# Patient Record
Sex: Female | Born: 1950
Health system: Southern US, Community
[De-identification: ages and names within clinical notes are randomized; demographics above are authoritative.]

## PROBLEM LIST (undated history)

## (undated) DIAGNOSIS — T7840XA Allergy, unspecified, initial encounter: Secondary | ICD-10-CM

## (undated) DIAGNOSIS — E785 Hyperlipidemia, unspecified: Secondary | ICD-10-CM

## (undated) DIAGNOSIS — M069 Rheumatoid arthritis, unspecified: Secondary | ICD-10-CM

## (undated) DIAGNOSIS — H269 Unspecified cataract: Secondary | ICD-10-CM

## (undated) DIAGNOSIS — G4730 Sleep apnea, unspecified: Secondary | ICD-10-CM

## (undated) DIAGNOSIS — K219 Gastro-esophageal reflux disease without esophagitis: Secondary | ICD-10-CM

## (undated) DIAGNOSIS — K2270 Barrett's esophagus without dysplasia: Secondary | ICD-10-CM

## (undated) DIAGNOSIS — K449 Diaphragmatic hernia without obstruction or gangrene: Secondary | ICD-10-CM

## (undated) DIAGNOSIS — I10 Essential (primary) hypertension: Secondary | ICD-10-CM

## (undated) DIAGNOSIS — F419 Anxiety disorder, unspecified: Secondary | ICD-10-CM

## (undated) DIAGNOSIS — I340 Nonrheumatic mitral (valve) insufficiency: Secondary | ICD-10-CM

## (undated) DIAGNOSIS — G43109 Migraine with aura, not intractable, without status migrainosus: Secondary | ICD-10-CM

## (undated) HISTORY — PX: UPPER GASTROINTESTINAL ENDOSCOPY: SHX188

## (undated) HISTORY — PX: COLONOSCOPY: SHX174

## (undated) HISTORY — DX: Essential (primary) hypertension: I10

## (undated) HISTORY — DX: Anxiety disorder, unspecified: F41.9

## (undated) HISTORY — DX: Allergy, unspecified, initial encounter: T78.40XA

## (undated) HISTORY — DX: Sleep apnea, unspecified: G47.30

## (undated) HISTORY — DX: Barrett's esophagus without dysplasia: K22.70

## (undated) HISTORY — DX: Nonrheumatic mitral (valve) insufficiency: I34.0

## (undated) HISTORY — DX: Gastro-esophageal reflux disease without esophagitis: K21.9

## (undated) HISTORY — DX: Migraine with aura, not intractable, without status migrainosus: G43.109

## (undated) HISTORY — DX: Unspecified cataract: H26.9

## (undated) HISTORY — DX: Diaphragmatic hernia without obstruction or gangrene: K44.9

## (undated) HISTORY — DX: Hyperlipidemia, unspecified: E78.5

## (undated) HISTORY — DX: Rheumatoid arthritis, unspecified: M06.9

---

## 1993-03-31 HISTORY — PX: VAGINAL HYSTERECTOMY: SHX2639

## 1999-02-06 ENCOUNTER — Other Ambulatory Visit: Admission: RE | Admit: 1999-02-06 | Discharge: 1999-02-06 | Payer: Self-pay | Admitting: Obstetrics & Gynecology

## 1999-05-29 ENCOUNTER — Other Ambulatory Visit: Admission: RE | Admit: 1999-05-29 | Discharge: 1999-05-29 | Payer: Self-pay | Admitting: Obstetrics & Gynecology

## 1999-06-24 ENCOUNTER — Other Ambulatory Visit: Admission: RE | Admit: 1999-06-24 | Discharge: 1999-06-24 | Payer: Self-pay | Admitting: Obstetrics & Gynecology

## 1999-07-24 ENCOUNTER — Other Ambulatory Visit: Admission: RE | Admit: 1999-07-24 | Discharge: 1999-07-24 | Payer: Self-pay | Admitting: Obstetrics & Gynecology

## 1999-08-12 ENCOUNTER — Other Ambulatory Visit: Admission: RE | Admit: 1999-08-12 | Discharge: 1999-08-12 | Payer: Self-pay | Admitting: Obstetrics & Gynecology

## 2000-02-05 ENCOUNTER — Other Ambulatory Visit: Admission: RE | Admit: 2000-02-05 | Discharge: 2000-02-05 | Payer: Self-pay | Admitting: Obstetrics & Gynecology

## 2000-07-29 ENCOUNTER — Other Ambulatory Visit: Admission: RE | Admit: 2000-07-29 | Discharge: 2000-07-29 | Payer: Self-pay | Admitting: Obstetrics & Gynecology

## 2001-03-17 ENCOUNTER — Other Ambulatory Visit: Admission: RE | Admit: 2001-03-17 | Discharge: 2001-03-17 | Payer: Self-pay | Admitting: Obstetrics & Gynecology

## 2001-11-04 ENCOUNTER — Other Ambulatory Visit: Admission: RE | Admit: 2001-11-04 | Discharge: 2001-11-04 | Payer: Self-pay | Admitting: Obstetrics & Gynecology

## 2002-06-10 ENCOUNTER — Other Ambulatory Visit: Admission: RE | Admit: 2002-06-10 | Discharge: 2002-06-10 | Payer: Self-pay | Admitting: Obstetrics & Gynecology

## 2002-07-22 ENCOUNTER — Encounter: Payer: Self-pay | Admitting: Internal Medicine

## 2002-07-28 ENCOUNTER — Encounter: Payer: Self-pay | Admitting: Internal Medicine

## 2002-07-28 ENCOUNTER — Ambulatory Visit (HOSPITAL_COMMUNITY): Admission: RE | Admit: 2002-07-28 | Discharge: 2002-07-28 | Payer: Self-pay | Admitting: Internal Medicine

## 2002-12-07 ENCOUNTER — Other Ambulatory Visit: Admission: RE | Admit: 2002-12-07 | Discharge: 2002-12-07 | Payer: Self-pay | Admitting: Obstetrics & Gynecology

## 2003-04-01 HISTORY — PX: LAPAROSCOPIC OOPHORECTOMY: SUR783

## 2003-08-02 ENCOUNTER — Other Ambulatory Visit: Admission: RE | Admit: 2003-08-02 | Discharge: 2003-08-02 | Payer: Self-pay | Admitting: Obstetrics and Gynecology

## 2003-08-02 ENCOUNTER — Other Ambulatory Visit: Admission: RE | Admit: 2003-08-02 | Discharge: 2003-08-02 | Payer: Self-pay | Admitting: Obstetrics & Gynecology

## 2004-02-02 ENCOUNTER — Other Ambulatory Visit: Admission: RE | Admit: 2004-02-02 | Discharge: 2004-02-02 | Payer: Self-pay | Admitting: Obstetrics & Gynecology

## 2004-08-02 ENCOUNTER — Other Ambulatory Visit: Admission: RE | Admit: 2004-08-02 | Discharge: 2004-08-02 | Payer: Self-pay | Admitting: Obstetrics & Gynecology

## 2005-01-31 ENCOUNTER — Other Ambulatory Visit: Admission: RE | Admit: 2005-01-31 | Discharge: 2005-01-31 | Payer: Self-pay | Admitting: Obstetrics & Gynecology

## 2006-05-26 ENCOUNTER — Encounter: Admission: RE | Admit: 2006-05-26 | Discharge: 2006-05-26 | Payer: Self-pay | Admitting: Orthopedic Surgery

## 2006-12-03 ENCOUNTER — Encounter: Admission: RE | Admit: 2006-12-03 | Discharge: 2006-12-03 | Payer: Self-pay | Admitting: Orthopedic Surgery

## 2007-10-25 ENCOUNTER — Encounter: Admission: RE | Admit: 2007-10-25 | Discharge: 2007-10-25 | Payer: Self-pay | Admitting: Family Medicine

## 2009-03-20 ENCOUNTER — Emergency Department (HOSPITAL_BASED_OUTPATIENT_CLINIC_OR_DEPARTMENT_OTHER): Admission: EM | Admit: 2009-03-20 | Discharge: 2009-03-20 | Payer: Self-pay | Admitting: Emergency Medicine

## 2009-03-20 ENCOUNTER — Ambulatory Visit: Payer: Self-pay | Admitting: Diagnostic Radiology

## 2009-03-31 HISTORY — PX: COLONOSCOPY: SHX174

## 2009-04-24 ENCOUNTER — Encounter: Payer: Self-pay | Admitting: Cardiology

## 2009-04-24 ENCOUNTER — Encounter (INDEPENDENT_AMBULATORY_CARE_PROVIDER_SITE_OTHER): Payer: Self-pay | Admitting: *Deleted

## 2009-05-09 ENCOUNTER — Ambulatory Visit: Payer: Self-pay | Admitting: Cardiology

## 2009-05-09 DIAGNOSIS — R079 Chest pain, unspecified: Secondary | ICD-10-CM

## 2009-05-09 DIAGNOSIS — E7800 Pure hypercholesterolemia, unspecified: Secondary | ICD-10-CM | POA: Insufficient documentation

## 2009-05-23 ENCOUNTER — Telehealth: Payer: Self-pay | Admitting: Cardiology

## 2009-05-29 ENCOUNTER — Ambulatory Visit: Payer: Self-pay

## 2009-05-29 ENCOUNTER — Ambulatory Visit (HOSPITAL_COMMUNITY): Admission: RE | Admit: 2009-05-29 | Discharge: 2009-05-29 | Payer: Self-pay | Admitting: Cardiology

## 2009-05-29 ENCOUNTER — Encounter: Payer: Self-pay | Admitting: Cardiology

## 2009-05-29 ENCOUNTER — Ambulatory Visit: Payer: Self-pay | Admitting: Internal Medicine

## 2009-05-31 DIAGNOSIS — G43109 Migraine with aura, not intractable, without status migrainosus: Secondary | ICD-10-CM | POA: Insufficient documentation

## 2009-05-31 DIAGNOSIS — R141 Gas pain: Secondary | ICD-10-CM

## 2009-05-31 DIAGNOSIS — G43909 Migraine, unspecified, not intractable, without status migrainosus: Secondary | ICD-10-CM

## 2009-05-31 DIAGNOSIS — R143 Flatulence: Secondary | ICD-10-CM

## 2009-05-31 DIAGNOSIS — R142 Eructation: Secondary | ICD-10-CM

## 2009-05-31 DIAGNOSIS — E785 Hyperlipidemia, unspecified: Secondary | ICD-10-CM | POA: Insufficient documentation

## 2009-05-31 DIAGNOSIS — F411 Generalized anxiety disorder: Secondary | ICD-10-CM

## 2009-06-04 ENCOUNTER — Ambulatory Visit: Payer: Self-pay | Admitting: Internal Medicine

## 2009-06-04 ENCOUNTER — Telehealth: Payer: Self-pay | Admitting: Internal Medicine

## 2009-06-04 DIAGNOSIS — K5900 Constipation, unspecified: Secondary | ICD-10-CM | POA: Insufficient documentation

## 2009-06-06 ENCOUNTER — Telehealth: Payer: Self-pay | Admitting: Cardiology

## 2009-06-06 ENCOUNTER — Ambulatory Visit: Payer: Self-pay | Admitting: Internal Medicine

## 2009-06-12 ENCOUNTER — Encounter: Payer: Self-pay | Admitting: Internal Medicine

## 2009-06-19 ENCOUNTER — Telehealth: Payer: Self-pay | Admitting: Internal Medicine

## 2009-07-06 ENCOUNTER — Ambulatory Visit: Payer: Self-pay | Admitting: Internal Medicine

## 2009-07-06 DIAGNOSIS — K2270 Barrett's esophagus without dysplasia: Secondary | ICD-10-CM | POA: Insufficient documentation

## 2009-09-18 ENCOUNTER — Telehealth: Payer: Self-pay | Admitting: Cardiology

## 2010-03-06 ENCOUNTER — Telehealth: Payer: Self-pay | Admitting: Cardiology

## 2010-04-28 LAB — CONVERTED CEMR LAB
HDL: 39.1 mg/dL (ref >39.00)
LDL Cholesterol: 81 mg/dL (ref 0–99)
Total Bilirubin: 0.7 mg/dL (ref 0.3–1.2)
Total CHOL/HDL Ratio: 4
Triglycerides: 94 mg/dL (ref 0.0–149.0)

## 2010-04-30 NOTE — Progress Notes (Signed)
Summary: rx request  Phone Note Call from Patient Call back at Home Phone [phone]   Caller: Patient [phone] Reason for Call: Talk to Nurse Summary of Call: simavastin for cholesterol wants called to [facility]-dr told her he would prescribe once she ran out  Initial call taken by: Glynda Jaeger,  September 18, 2009 2:12 PM    Prescriptions: SIMVASTATIN 20 MG TABS (SIMVASTATIN) once daily  #30 x 6   Entered by:   Judithe Modest CMA   Authorized by:   Marca Ancona, MD   Signed by:   Judithe Modest CMA on 09/18/2009   Method used:   [facility]        [facility] [phone]* (retail)       [address]       [place], [place]  [address]       Ph: [phone]       Fax: [phone]   RxID:   [id number]

## 2010-04-30 NOTE — Letter (Signed)
Summary: [facility] Instructions  [facility]  [address] [place], [place] [address]   Phone: [phone]  Fax: [phone]       Cynthia Strickland    11-Apr-1950    MRN: [id number]       Procedure Day /Date: 06/06/09 (Wednesday)     Arrival Time: 1:00 pm     Procedure Time: 2:00 pm     Location of Procedure:                    _x _  [facility] (4th Floor)   PREPARATION FOR COLONOSCOPY WITH MIRALAX  Starting 5 days prior to your procedure (today) do not eat nuts, seeds, popcorn, corn, beans, peas,  salads, or any raw vegetables.  Do not take any fiber supplements (e.g. Metamucil, Citrucel, and Benefiber). ____________________________________________________________________________________________________   THE DAY BEFORE YOUR PROCEDURE         DATE: 06/05/09 DAY: Tuesday  1   Drink clear liquids the entire day-NO SOLID FOOD  2   Do not drink anything colored red or purple.  Avoid juices with pulp.  No orange juice.  3   Drink at least 64 oz. (8 glasses) of fluid/clear liquids during the day to prevent dehydration and help the prep work efficiently.  CLEAR LIQUIDS INCLUDE: Water Jello Ice Popsicles Tea (sugar ok, no milk/cream) Powdered fruit flavored drinks Coffee (sugar ok, no milk/cream) Gatorade Juice: apple, white grape, white cranberry  Lemonade Clear bullion, consomm, broth Carbonated beverages (any kind) Strained chicken noodle soup Hard Candy  4   Mix the entire bottle of Miralax with 64 oz. of Gatorade/Powerade in the morning and put in the refrigerator to chill.  5   At 3:00 pm take 2 Dulcolax/Bisacodyl tablets.  6   At 4:30 pm take one Reglan/Metoclopramide tablet.  7  Starting at 5:00 pm drink one 8 oz glass of the Miralax mixture every 15-20 minutes until you have finished drinking the entire 64 oz.  You should finish drinking prep around 7:30 or 8:00 pm.  8   If you are nauseated, you may take the 2nd Reglan/Metoclopramide  tablet at 6:30 pm.        9    At 8:00 pm take 2 more DULCOLAX/Bisacodyl tablets.        THE DAY OF YOUR PROCEDURE      DATE:  06/06/09 DAY: Wednesday  You may drink clear liquids until 12:00 pm  (2 HOURS BEFORE PROCEDURE).   MEDICATION INSTRUCTIONS  Unless otherwise instructed, you should take regular prescription medications with a small sip of water as early as possible the morning of your procedure.       OTHER INSTRUCTIONS  You will need a responsible adult at least 58 years of age to accompany you and drive you home.   This person must remain in the waiting room during your procedure.  Wear loose fitting clothing that is easily removed.  Leave jewelry and other valuables at home.  However, you may wish to bring a book to read or an iPod/MP3 player to listen to music as you wait for your procedure to start.  Remove all body piercing jewelry and leave at home.  Total time from sign-in until discharge is approximately 2-3 hours.  You should go home directly after your procedure and rest.  You can resume normal activities the day after your procedure.  The day of your procedure you should not:   Drive   Make  legal decisions   Operate machinery   Drink alcohol   Return to work  You will receive specific instructions about eating, activities and medications before you leave.   The above instructions have been reviewed and explained to me by  Hortense Ramal CMA Duncan Dull)  June 04, 2009 1:54 PM     I fully understand and can verbalize these instructions _____________________________ Date 06/04/09

## 2010-04-30 NOTE — Letter (Signed)
Summary: New Patient letter  [facility]  [address] [place], [place] [address]   Phone: [phone]  Fax: [phone]       04/24/2009 MRN: [id number]  [facility] Cynthia Strickland [address] [place], [place]  [address]  Dear Cynthia Strickland,  Welcome to the Gastroenterology Division at [facility].    You are scheduled to see Dr. Juanda Chance on 06-04-09 at 1:30p.m. on the 3rd floor at [facility], [address]  We ask that you try to arrive at our office 15 minutes prior to your appointment time to allow for check-in.  We would like you to complete the enclosed self-administered evaluation form prior to your visit and bring it with you on the day of your appointment.  We will review it with you.  Also, please bring a complete list of all your medications or, if you prefer, bring the medication bottles and we will list them.  Please bring your insurance card so that we may make a copy of it.  If your insurance requires a referral to see a specialist, please bring your referral form from your primary care physician.  Co-payments are due at the time of your visit and may be paid by cash, check or credit card.     Your office visit will consist of a consult with your physician (includes a physical exam), any laboratory testing he/she may order, scheduling of any necessary diagnostic testing (e.g. x-ray, ultrasound, CT-scan), and scheduling of a procedure (e.g. Endoscopy, Colonoscopy) if required.  Please allow enough time on your schedule to allow for any/all of these possibilities.    If you cannot keep your appointment, please call [phone] to cancel or reschedule prior to your appointment date.  This allows [place] the opportunity to schedule an appointment for another patient in need of care.  If you do not cancel or reschedule by 5 p.m. the business day prior to your appointment date, you will be charged a $50.00 late cancellation/no-show fee.    Thank you for choosing  [facility] for your medical needs.  We appreciate the opportunity to care for you.  Please visit [place] at our website  to learn more about our practice.                     Sincerely,                                                             The Gastroenterology Division

## 2010-04-30 NOTE — Assessment & Plan Note (Signed)
Summary: (4pm appt)  F/U FROM ENDO. AND NEW DX. OF BARRETT'S          ...   History of Present Illness Visit Type: Follow-up Visit Primary GI MD: Lina Sar MD Primary Provider: n/a Requesting Provider: n/a Chief Complaint: F/u from EGD. Pt states that she feels better and denies any GI complaints  History of Present Illness:   60 year old white female returns for followup of Barrett's esophagus which was diagnosed on upper endoscopy on 06/06/09. She had goblet  cells and metaplasia on  biopsies from the GE junction. Patient initially presented with severe gastroesophageal reflux. She has been markedly improved, Protonix 40 mg twice a day and antireflux measures. Her recall endoscopy will be in March 2013 . She has  3 cm hiatal hernia. Colonoscopy  on 06/06/09 for evaluation of  abdominal pain and constipation  showed mild diverticulosis of the left colon. She has responded to MiraLax 19 g every other day   GI Review of Systems      Denies abdominal pain, acid reflux, belching, bloating, chest pain, dysphagia with liquids, dysphagia with solids, heartburn, loss of appetite, nausea, vomiting, vomiting blood, weight loss, and  weight gain.        Denies anal fissure, black tarry stools, change in bowel habit, constipation, diarrhea, diverticulosis, fecal incontinence, heme positive stool, hemorrhoids, irritable bowel syndrome, jaundice, light color stool, liver problems, rectal bleeding, and  rectal pain.    Current Medications (verified): 1)  Simvastatin 20 Mg Tabs (Simvastatin) .... Once Daily 2)  Lorazepam 0.5 Mg Tabs (Lorazepam) .... As Needed 3)  Aspirin 81 Mg Tbec (Aspirin) .... Take One Tablet By Mouth Daily 4)  Vitamin D 2000 Unit Tabs (Cholecalciferol) .... Take 1 Tablet By Mouth Once A Day 5)  Citracal Plus  Tabs (Multiple Minerals-Vitamins) .... Take 1 Tablet By Mouth Once A Day 6)  Protonix 40 Mg  Tbec (Pantoprazole Sodium) .Marland Kitchen.. 1 Twice A Day 30 Minutes Before Meals  Allergies  (verified): 1)  ! Sulfa 2)  Codeine  Past History:  Past Medical History: Reviewed history from 06/04/2009 and no changes required. 1. Hyperlipidemia 2. GERD 3. Mild obesity Anxiety Disorder Fibromyalgia ?  Past Surgical History: Reviewed history from 06/04/2009 and no changes required. Hysterectomy BSO  Family History: Father with MI at age 36 but had angina prior.  Several uncles with MIs in their 41s and 62s.  Family History of Uterine Cancer: Mother Family History of Pancreatic Cancer: MGM No FH of Colon Cancer:  Social History: Reviewed history from 06/04/2009 and no changes required. 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  Patient is a former smoker. -stopped in 1989 Daily Caffeine Use 1/day  Review of Systems  The patient denies allergy/sinus, anemia, anxiety-new, arthritis/joint pain, back pain, blood in urine, breast changes/lumps, change in vision, confusion, cough, coughing up blood, depression-new, fainting, fatigue, fever, headaches-new, hearing problems, heart murmur, heart rhythm changes, itching, menstrual pain, muscle pains/cramps, night sweats, nosebleeds, pregnancy symptoms, shortness of breath, skin rash, sleeping problems, sore throat, swelling of feet/legs, swollen lymph glands, thirst - excessive , urination - excessive , urination changes/pain, urine leakage, vision changes, and voice change.         Pertinent positive and negative review of systems were noted in the above HPI. All other ROS was otherwise negative.   Vital Signs:  Patient profile:   60 year old female Height:      60 inches Weight:  148 pounds BSA:     1.64 Pulse rate:   88 / minute Pulse rhythm:   regular BP sitting:   122 / 72  (left arm) Cuff size:   regular  Vitals Entered By: Ok Anis CMA (July 06, 2009 4:37 PM)  Physical Exam  General:  Well developed, well nourished, no acute distress. Abdomen:  Soft, nontender and nondistended. No  masses, hepatosplenomegaly or hernias noted. Normal bowel sounds. Extremities:  No clubbing, cyanosis, edema or deformities noted. Skin:  Intact without significant lesions or rashes. Psych:  Alert and cooperative. Normal mood and affect.   Impression & Recommendations:  Problem # 1:  BARRETTS ESOPHAGUS (ICD-530.85) new diagnosis of Barrett's esophagus. Patient is improved on Protonix 40 mg twice a day. She will continue the same regimen and have repeat upper endoscopy in March 2013  Problem # 2:  ABDOMINAL BLOATING (ICD-787.3) much improved on MiraLax 9 g every other day. We'll continue high-fiber diet and even fiber supplements as needed. Recall colonoscopy in 10 years  Patient Instructions: 1)  antireflux measures 2)  Protonix 40 mg p.o. b.i.d. 3)  Repeat upper endoscopy in March 2013 4)  Recall colonoscopy March 2021 5)  continue MiraLax 9 g every other day

## 2010-04-30 NOTE — Letter (Signed)
Summary: [facility] Office Note  [facility] Office Note   Imported By: Roderic Ovens 05/18/2009 12:00:21  _____________________________________________________________________  External Attachment:    Type:   Image     Comment:   External Document

## 2010-04-30 NOTE — Progress Notes (Signed)
Summary: REV Scheduled  Phone Note Call from Patient Call back at Work Phone [phone]   Caller: Patient Call For: Juanda Chance Reason for Call: Talk to Nurse Summary of Call: Patient has questions regarding biopsy result letter she received. Initial call taken by: Tawni Levy,  June 19, 2009 1:36 PM  Follow-up for Phone Call        Pt. wanted to schedule her REV with Dr.Brodie.  She is scheduled for 07-06-09 at 4pm. Pt. instructed to call back as needed.  Follow-up by: Laureen Ochs LPN,  June 19, 2009 1:49 PM

## 2010-04-30 NOTE — Assessment & Plan Note (Signed)
Summary: np6/family hx of heart disease   Visit Type:  np6  Referring Provider:  Dr. Jennette Kettle Primary Provider:  Dr. Cliffton Asters  CC:  chest pain and discomfront.  History of Present Illness: 60 yo with history of hyperlipidemia and GERD presents for evaluation of chest pain.  She has been having burning in her chest  as well as hoarseness and a sore throat off and on for 6 months.  It can happen both before and after meals.  It has not been related to exertion.  She has recently started taking Protonix, and this burning pain has really completely resolved.  It seems to be GERD, but she is worried because her father had chest pain that he thought was GERD prior to his heart attack.  She also gets a separate sharp lower chest pain.  This will only last for a couple of seconds and then resolves. It is also not associated with exertion.  Her BP has been running high at home, in the 140s/90s.  She rides her stationary bike and walks without exertional shortness of breath.   ECG: NSR, normal  Current Medications (verified): 1)  Simvastatin 20 Mg Tabs (Simvastatin) .... Once Daily 2)  Protonix 40 Mg Solr (Pantoprazole Sodium) .... Once Daily 3)  Lorazepam 0.5 Mg Tabs (Lorazepam) .... Prn 4)  Aspirin 81 Mg Tbec (Aspirin) .... Take One Tablet By Mouth Daily  Allergies (verified): 1)  ! * Sulfur 2)  ! * Codiene  Past History:  Past Medical History: 1. Hyperlipidemia 2. GERD 3. Mild obesity  Family History: Father with MI at age 15 but had angina prior.  Several uncles with MIs in their 100s and 45s.   Social History: Quit smoking in 1989.  She works in an Warden/ranger.  Rare ETOH.   Review of Systems       All systems reviewed and negative except as per HPI.   Vital Signs:  Patient profile:   60 year old female Height:      60 inches Weight:      147 pounds BMI:     28.81 Pulse rate:   75 / minute BP sitting:   136 / 97  (left arm) Cuff size:   large  Vitals Entered By: Oswald Hillock (May 09, 2009 9:41 AM)  Physical Exam  General:  Well developed, well nourished, in no acute distress. Mild obesity.  Head:  normocephalic and atraumatic Nose:  no deformity, discharge, inflammation, or lesions Mouth:  Teeth, gums and palate normal. Oral mucosa normal. Neck:  Neck supple, no JVD. No masses, thyromegaly or abnormal cervical nodes. Lungs:  Clear bilaterally to auscultation and percussion. Heart:  Non-displaced PMI, chest non-tender; regular rate and rhythm, S1, S2 without murmurs, rubs or gallops. Carotid upstroke normal, no bruit. Pedals normal pulses. No edema, no varicosities. Abdomen:  Bowel sounds positive; abdomen soft and non-tender without masses, organomegaly, or hernias noted. No hepatosplenomegaly. Msk:  Back normal, normal gait. Muscle strength and tone normal. Extremities:  No clubbing or cyanosis. Neurologic:  Alert and oriented x 3. Skin:  Intact without lesions or rashes. Psych:  Normal affect.   Impression & Recommendations:  Problem # 1:  CHEST PAIN (ICD-786.50) Patient has had burning in her chest that has actually resolved with Protonix.  I expect it is GERD.  She will see GI soon.  She has a separate atypical sharp pain in her chest (nonexertional) that is different in character than the burning and that she  still has episodes of.  She does have a family history of CAD as well as hyperlipidemia.  I will set her up for a stress echocardiogram for risk stratification.  She should take ASA 81 mg daily, especially given her family history.   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    Problem # 3:  ELEVATED BLOOD PRESSURE BP has been running high recently.  It is 136/97 today.  She will check it daily and we will give her a call in a couple of weeks to see what her numbers are.    Other Orders: EKG w/ Interpretation (93000) TLB-Lipid Panel (80061-LIPID) TLB-Hepatic/Liver Function Pnl  (80076-HEPATIC) Stress Echo (Stress Echo)  Patient Instructions: 1)  Your physician recommends that you have  a FASTING lipid profile/liver profile today 786.50 272.0 2)  Start Aspirin 81mg  daily---this should be buffered or coated 3)  Take and record yor blood pressure and pulse--I will call you in 2 weeks to get the readings. Luana Shu [phone]   4)  Your physician has requested that you have a stress echocardiogram. For further information please visit [email or web address]  Please follow instruction sheet as given. 5)  Your physician wants you to follow-up in:  1 year with Dr Marca Ancona.  You will receive a reminder letter in the mail two months in advance. If you don't receive a letter, please call our office to schedule the follow-up appointment.

## 2010-04-30 NOTE — Progress Notes (Signed)
Summary: Triage  Phone Note Call from Patient Call back at [phone]   Caller: Patient Call For: Dr. Juanda Chance Reason for Call: Talk to Nurse Summary of Call: Pt. has some questions about her procedure/prep on Wed. Initial call taken by: Karna Christmas,  June 04, 2009 4:15 PM  Follow-up for Phone Call        Clarified that she did get rt. amt of Miralax. Follow-up by: Teryl Lucy RN,  June 04, 2009 4:34 PM

## 2010-04-30 NOTE — Miscellaneous (Signed)
Summary: Protonix rx  Clinical Lists Changes  Medications: Added new medication of PROTONIX 40 MG  TBEC (PANTOPRAZOLE SODIUM) 1 twice a day 30 minutes before meals - Signed Rx of PROTONIX 40 MG  TBEC (PANTOPRAZOLE SODIUM) 1 twice a day 30 minutes before meals;  #60 x 3;  Signed;  Entered by: Karl Bales RN;  Authorized by: Hart Carwin MD;  Method used: [facility] [phone]*, [address] [place], [place], [place]  [address], Ph: [phone], Fax: [phone]    Prescriptions: PROTONIX 40 MG  TBEC (PANTOPRAZOLE SODIUM) 1 twice a day 30 minutes before meals  #60 x 3   Entered by:   Karl Bales RN   Authorized by:   Hart Carwin MD   Signed by:   Karl Bales RN on 06/06/2009   Method used:   Electronically to        [facility] [phone]* (retail)       [address]       [place], [place]  [address]       Ph: [phone]       Fax: [phone]   RxID:   [id number]

## 2010-04-30 NOTE — Procedures (Signed)
Summary: COLON   Colonoscopy  Procedure date:  07/28/2002  Findings:      Location:  [facility].     NAME:  Cynthia Strickland, Cynthia Strickland                       ACCOUNT NO.:  [id number]   MEDICAL RECORD NO.:  [id number]                   PATIENT TYPE:  AMB   LOCATION:  ENDO                                 FACILITY:  [facility]   PHYSICIAN:  Lina Sar, M.D. [facility]               DATE OF BIRTH:  [birth year]   DATE OF PROCEDURE:  DATE OF DISCHARGE:                                 OPERATIVE REPORT   PROCEDURE:  Colonoscopy.   HISTORY:  This 60 year old white female has experienced crampy lower  abdominal pain and change in her bowel habits.  She has been rather  constipated recently.  She gives history of symptomatic hemorrhoids.  Her  physical exam on 07/22/02 showed hemoccult-negative stool.  She was started  on high-fiber diet, Zelnorm 6 mg p.o. q.d. and Metamucil with some initial  improvement in her symptoms.  She is undergoing colonoscopy for neoplastic  screening.   ENDOSCOPE:  Olympus single-channel endoscope.   SEDATION:  Versed 7.5 mg, IV Demerol 100 mg IV.   PROCEDURE:  Olympus single-channel video endoscope passed under direct  vision through posterior rectum to the sigmoid colon.  Patient was monitored  by pulse oximeter.  Oxygen saturations were normal.  Anal canal and rectum  were both unremarkable.  Sigmoid colon was straight without tortuosity.  There were no diverticuli and folds are normal.  Colonoscope passed easily  through the splenic flexure, transverse colon, hepatic flexure, ascending  colon to the cecum.  Appendiceal  opening areas throughout appeared normal.  After reaching the cecum, colonoscope was slowly retracted, and colon  decompressed. There were no abnormalities.   IMPRESSION:  Normal colonoscopy to the cecum.   PLAN:  1. High-fiber diet.  2. Continue Zelnorm 6 mg daily.  3. Repeat colonoscopy in 10 years.             Lina Sar, M.D. [facility]    DB/MEDQ  D:  07/28/2002  T:  07/28/2002  Job:  [id number]   cc:   Merlene Laughter, M.D.

## 2010-04-30 NOTE — Procedures (Signed)
Summary: Colonoscopy  Patient: Cynthia Strickland Note: All result statuses are Final unless otherwise noted.  Tests: (1) Colonoscopy (COL)   COL Colonoscopy           DONE     [facility]     [address]     [place], [place]  [address]           COLONOSCOPY PROCEDURE REPORT           PATIENT:  Cynthia, Strickland  MR#:  [id number]     BIRTHDATE:  1950/11/16, 58 yrs. old  GENDER:  female           ENDOSCOPIST:  Hedwig Morton. Juanda Chance, MD     Referred by:  Varney Baas, M.D.           PROCEDURE DATE:  06/06/2009     PROCEDURE:  Colonoscopy 19[address]     ASA CLASS:  Class I     INDICATIONS:  abdominal pain, constipation           MEDICATIONS:   Versed 4 mg, Fentanyl 50 mcg           DESCRIPTION OF PROCEDURE:   After the risks benefits and     alternatives of the procedure were thoroughly explained, informed     consent was obtained.  Digital rectal exam was performed and     revealed no rectal masses.   The LB [id number] endoscope     was introduced through the anus and advanced to the cecum, which     was identified by both the appendix and ileocecal valve, without     limitations.  The quality of the prep was good, using MiraLax.     The instrument was then slowly withdrawn as the colon was fully     examined.     <<PROCEDUREIMAGES>>           FINDINGS:  Mild diverticulosis was found (see image1 and image3).     few scattered sigmoid diverticuli  Internal hemorrhoids were found     (see image5 and image4).  This was otherwise a normal examination     of the colon (see image2).   Retroflexed views in the rectum     revealed no abnormalities.    The scope was then withdrawn from     the patient and the procedure completed.           COMPLICATIONS:  None           ENDOSCOPIC IMPRESSION:     1) Mild diverticulosis     2) Internal hemorrhoids     3) Otherwise normal examination     RECOMMENDATIONS:     1) high fiber diet     Miralax 9 gms qd or qod           REPEAT  EXAM:  No           ______________________________     Hedwig Morton. Juanda Chance, MD           CC:           n.     eSIGNED:   Hedwig Morton. Kamir Selover at 06/06/2009 02:56 PM           Donalynn Furlong, [phone]  Note: An exclamation mark (!) indicates a result that was not dispersed into the flowsheet. Document Creation Date: 06/06/2009 2:56 PM _______________________________________________________________________  (1) Order result status: Final Collection or observation date-time: 06/06/2009 14:34 Requested date-time:  Receipt  date-time:  Reported date-time:  Referring Physician:   Ordering Physician: Lina Sar [phone]) Specimen Source:  Source: Launa Grill Order Number: [id number] Lab site:

## 2010-04-30 NOTE — Progress Notes (Signed)
Summary: B/P readings  Phone Note Outgoing Call   Call placed by: Katina Dung, RN, BSN,  May 23, 2009 8:03 AM Call placed to: Patient Summary of Call: B/P readings  Follow-up for Phone Call        call pt to get B/P readings--echo/stress echo 05-29-09 talked with patient--she has only checked her B/P a couple of times since 05-09-09 OV with Dr. Marca Ancona but she said they were "good"-she will try to take her B/P regularly and bring the readings in when she comes for her testing 05-29-09

## 2010-04-30 NOTE — Assessment & Plan Note (Signed)
Summary: BLOATING--CH.   History of Present Illness Visit Type: consult Primary GI MD: Lina Sar MD Primary Provider: Dr. Cliffton Asters Requesting Provider: Varney Baas, MD Chief Complaint: bloating and reflux History of Present Illness:   This is a 60 year old white Strickland with a one-year history of left upper abdominal quadrant discomfort along the left costal margin radiating to the chest and sometimes into the interscapular area. She was recently hospitalized for noncardiac chest pain and had a normal stress echocardiogram with a 75% ejection fraction. She continues to have pain in the left upper quadrant which is present regardless of eating but it is usually relieved by lying down at night. She has had progressive constipation. 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 She has taken over-the-counter laxatives. A colonoscopy in April 2004 was normal. There is a strong family history of gallbladder disease in her maternal aunt and her cousins.   GI Review of Systems    Reports acid reflux, belching, bloating, chest pain, heartburn, and  weight gain.      Denies abdominal pain, dysphagia with liquids, dysphagia with solids, loss of appetite, nausea, vomiting, vomiting blood, and  weight loss.      Reports constipation.     Denies anal fissure, black tarry stools, change in bowel habit, diarrhea, diverticulosis, fecal incontinence, heme positive stool, hemorrhoids, irritable bowel syndrome, jaundice, light color stool, liver problems, rectal bleeding, and  rectal pain.     Current Medications (verified): 1)  Simvastatin 20 Mg Tabs (Simvastatin) .... Once Daily 2)  Protonix 40 Mg Solr (Pantoprazole Sodium) .... Once Daily 3)  Lorazepam 0.5 Mg Tabs (Lorazepam) .... As Needed 4)  Aspirin 81 Mg Tbec (Aspirin) .... Take One Tablet By Mouth Daily 5)  Vitamin D 2000 Unit Tabs (Cholecalciferol) .... Take 1 Tablet By Mouth Once A Day 6)  Citracal Plus  Tabs (Multiple Minerals-Vitamins) ....  Take 1 Tablet By Mouth Once A Day  Allergies: 1)  ! Sulfa 2)  Codeine  Past History:  Past Medical History: 1. Hyperlipidemia 2. GERD 3. Mild obesity Anxiety Disorder Fibromyalgia ?  Past Surgical History: Hysterectomy BSO  Family History: Father with MI at age 15 but had angina prior.  Several uncles with MIs in their 52s and 32s.  Family History of Uterine Cancer: Mother Family History of Pancreatic Cancer: MGM  Social History: 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  Patient is a former smoker. -stopped in 1989 Daily Caffeine Use 1/day  Review of Systems       The patient complains of anxiety-new, back pain, headaches-new, sleeping problems, sore throat, and thirst - excessive.  The patient denies allergy/sinus, anemia, arthritis/joint pain, blood in urine, breast changes/lumps, confusion, cough, coughing up blood, depression-new, fainting, fatigue, fever, hearing problems, heart murmur, heart rhythm changes, itching, menstrual pain, muscle pains/cramps, night sweats, nosebleeds, pregnancy symptoms, shortness of breath, skin rash, swelling of feet/legs, swollen lymph glands, urination - excessive, urination changes/pain, urine leakage, vision changes, and voice change.         Pertinent positive and negative review of systems were noted in the above HPI. All other ROS was otherwise negative.   Vital Signs:  Patient profile:   60 year old Strickland Height:      60 inches Weight:      150 pounds BMI:     29.40 Pulse rate:   80 / minute Pulse rhythm:   regular BP sitting:   112 / 80  (  left arm) Cuff size:   regular  Vitals Entered By: Francee Piccolo CMA Duncan Dull) (June 04, 2009 1:32 PM)  Physical Exam  General:  Well developed, well nourished, no acute distress. Eyes:  PERRLA, no icterus. Mouth:  No deformity or lesions, dentition normal. Neck:  Supple; no masses or thyromegaly. Lungs:  Clear throughout to auscultation. Heart:  Regular  rate and rhythm; no murmurs, rubs,  or bruits. Abdomen:  mildly protuberant obese abdomen which is soft and has normoactive bowel sounds. There is tenderness along the costal margin and laterally. There is no tenderness in right upper or lower quadrants. There was no palpable mass. Liver edge is at the costal margin. Rectal:  soft Hemoccult negative stool. Extremities:  No clubbing, cyanosis, edema or deformities noted. Skin:  Intact without significant lesions or rashes. Psych:  Alert and cooperative. Normal mood and affect.   Impression & Recommendations:  Problem # 1:  ABDOMINAL BLOATING (ICD-787.3) We will obtain an upper abdominal ultrasound as well as a tissue transglutaminase level.  Orders: T-Tissue Transglutamase Ab IgA [id number]) Colon/Endo (Colon/Endo) Ultrasound Abdomen (UAS)  Problem # 2:  CHEST PAIN (ICD-786.50)  Patient has chest pain likely due to gastroesophageal reflux. She is to continue Protonix 40 mg daily and proceed with an upper endoscopy. We need to rule out H. pylori gastritis or Advil-induced gastropathy.  Orders: Colon/Endo (Colon/Endo) Ultrasound Abdomen (UAS)  Problem # 3:  UNSPECIFIED CONSTIPATION (ICD-564.00) Patient likely has functional constipation. She had a normal colonoscopy in April 2004. Stool is Hemoccult negative. Her left upper quadrant abdominal discomfort may be related to splenic flexure syndrome. We will proceed with a colonoscopy per patient's request.  Orders: T-Tissue Transglutamase Ab IgA ([id number]) Colon/Endo (Colon/Endo) Ultrasound Abdomen (UAS)  Patient Instructions: 1)  MiraLax 17 g daily for constipation. 2)  Schedule colonoscopy. 3)  Continue Protonix 40 mg p.o. q.d. 4)  Reduce Advil and other anti-inflammatory agents. 5)  Upper endoscopy scheduled. 6)  Upper abdominal ultrasound. 7)  TTG to be drawn. 8)  Copy sent to : Dr Konrad Dolores 9)  The medication list was reviewed and reconciled.  All changed / newly  prescribed medications were explained.  A complete medication list was provided to the patient / caregiver. Prescriptions: DULCOLAX 5 MG  TBEC (BISACODYL) Day before procedure take 2 at 3pm and 2 at 8pm.  #4 x 0   Entered by:   Hortense Ramal CMA (AAMA)   Authorized by:   Hart Carwin MD   Signed by:   Hortense Ramal CMA (AAMA) on 06/04/2009   Method used:   Electronically to        [facility] [phone]* (retail)       [address]       [place], [place]  [address]       Ph: [phone]       Fax: [phone]   RxID:   [id number] REGLAN 10 MG  TABS (METOCLOPRAMIDE HCL) As per prep instructions.  #2 x 0   Entered by:   Hortense Ramal CMA (AAMA)   Authorized by:   Hart Carwin MD   Signed by:   Hortense Ramal CMA (AAMA) on 06/04/2009   Method used:   Electronically to        [facility] [phone]* (retail)       [address]       [place]  [place], [place]  [address]       Ph: [phone]       Fax: [phone]   RxID:   [id number] MIRALAX   POWD (POLYETHYLENE GLYCOL 3350) As per prep  instructions.  #255gm x 0   Entered by:   Hortense Ramal CMA (AAMA)   Authorized by:   Hart Carwin MD   Signed by:   Hortense Ramal CMA (AAMA) on 06/04/2009   Method used:   Electronically to        [facility] [phone]* (retail)       [address]       [place], [place]  [address]       Ph: [phone]       Fax: [phone]   RxID:   [id number]

## 2010-04-30 NOTE — Letter (Signed)
Summary: Patient Notice- Polyp Results  [facility]  [address] [place], [place] [address]   Phone: [phone]  Fax: [phone]        June 08, 2009 MRN: [id number]    Cynthia Strickland [address] [place], [place]  [address]    Dear [name],  I am pleased to inform you that the biopsies taken during your recent endoscopic examination did not show any evidence of cancer upon pathologic examination.  However, your biopsies indicate you have a condition known as Barrett's esophagus. While not cancer, it is pre-cancerous (can progress to cancer) and needs to be monitored with repeat endoscopic examination and biopsies.  Fortunately, it is quite rare that this develops into cancer, but careful monitoring of the condition along with taking your medication as prescribed is important in reducing the risk of developing cancer.  It is my recommendation that you have a repeat upper gastrointestinal endoscopic examination in _2 years.  Additional information/recommendations:  _x_Please call [phone] to schedule a return visit to further      evaluate your condition.  _x_Continue with treatment plan as outlined the day of your exam.Protonix 40 mg two times a day  Please call us if you have or develop heartburn, reflux symptoms, any swallowing problems, or if you have questions about your condition that have not been fully answered at this time.  Sincerely,  Hart Carwin MD  This letter has been electronically signed by your physician.  Appended Document: Patient Notice- Polyp Results Letter mailed 3.15.11.

## 2010-04-30 NOTE — Progress Notes (Signed)
Summary: Discuss Lab  Phone Note Outgoing Call   Call placed by: Bernita Raisin, RN, BSN,  June 06, 2009 3:36 PM Call placed to: Patient Reason for Call: Discuss lab or test results Summary of Call: RN Left Message To Call Back. Bernita Raisin, RN, BSN  June 06, 2009 3:36 PM

## 2010-04-30 NOTE — Assessment & Plan Note (Signed)
Summary: Gastroenterology  [place]  MR#:  [id number] Page #  [facility]   GASTROENTEROLOGY OFFICE NOTE  NAME:  Cynthia, Strickland   OFFICE NO:  [id number]  DATE:  07/22/02  DOB:  [birth year]  The patient is a very nice 60 year old patient of Dr. Varney Baas, who was referred for evaluation of symptoms suggestive of irritable bowel syndrome.    For the past two or three years, the patient has had change in her bowel habits from having regular bowel movements on a daily basis to having bloating, gas, difficult evacuation, hard, small stools every two or three days with occasional episodes of frequent bowel movements with diarrhea.  She has been on Weight Watchers off and on for several years.  She drinks a lot of diet drinks, but her weight has been recently stable.  She denies any rectal bleeding, and has had hemoccult cards in the past, which all were negative.  She is naturally concerned about colon cancer because of the change in her bowel habits which she cannot explain, because her eating habits have been the same.  She is also experiencing a lot of crampy lower abdominal pain prior to the bowel movements.  The pain seems to continue briefly after the evacuation of the stool.  She has occasional hot flashes which she attributes to perimenopausal symptoms.  She had pelvic ultrasound at Dr. Donnetta Hail office and was found to have a small left ovarian cyst.  ALLERGIES:  To codeine and sulfa.   MEDICATIONS:  None.   ILLNESSES:  Hyperlipidemia, anxiety, chronic migraine headaches.     OPERATIONS:  Hysterectomy and right oophorectomy.   FAMILY HISTORY: Family history positive for uterine cancer, diabetes, and heart disease.   SOCIAL HISTORY:  She stopped smoking 15 years ago.  She drinks alcohol, two to three glasses of wine, on the weekend.  She is married, and works as a Multimedia programmer in a Administrator, arts.   REVIEW OF SYSTEMS:   Positive for chronic back pain.   PHYSICAL  EXAMINATION:   VITAL SIGNS:  Blood pressure 110/78.  Pulse 68.  Weight 130 pounds.  GENERAL:  She was alert and oriented, in no distress.  She was very cooperative.  SKIN:  Her skin was warm and dry.  HEENT:  Sclerae was nonicteric.  Oral cavity was normal.  NECK:  Supple with no adenopathy.  Thyroid was normal.  AXILLA:  Clear to auscultation.  COR:  Quiet precordium.  Normal S1.  Normal S2.  ABDOMEN: Soft.  Relaxed with normoactive bowel sounds.  I could not elicit any tenderness.  The examination of the lower abdomen was unremarkable.  Right lower quadrant showed no mass or palpable stools.  No costovertebral angle tenderness.  RECTAL:  Examination showed no masses present.  Hemoccult-negative stool.    IMPRESSION:   A 60 year old white female with change in bowel habits with no evidence of failure to thrive.  No rectal bleeding. She is at the age group where we would recommend colorectal screening for colon cancer.  Her symptoms are more suggestive of colonic inertia, or an irritable bowel syndrome.  She also has symptoms of pelvic relaxation because of poor evacuation.    PLAN:    1.  I have scheduled her for colonoscopy for July 28, 2002, at [facility] at 1 p.m.   2.  Begin Zelnorm 6 mg p.o. q.d.  This may be ultimately increased to twice a day dose.   3.  Begin Metamucil wafers.  She prefers wafers from taking the powder.  Two wafers would give her an additional 3 grams of psyllium a day.  She was also given high-fiber diet to take on a daily basis.   4.  We are also checking her TSH today.      Hedwig Morton. Juanda Chance, M.D.  CWC/3762 cc:  Varney Baas, M.D., gynecologist D:  07/22/02; T:  ; Job [phone]

## 2010-04-30 NOTE — Procedures (Signed)
Summary: Upper Endoscopy  Patient: [name] Note: All result statuses are Final unless otherwise noted.  Tests: (1) Upper Endoscopy (EGD)   EGD Upper Endoscopy       DONE     [facility]     [address]     [place], [place]  [address]           ENDOSCOPY PROCEDURE REPORT           PATIENT:  Cynthia Strickland, Cynthia Strickland  MR#:  [id number]     BIRTHDATE:  1951-03-20, 58 yrs. old  GENDER:  female           ENDOSCOPIST:  Hedwig Morton. Juanda Chance, MD     Referred by:           PROCEDURE DATE:  06/06/2009     PROCEDURE:  EGD with biopsy     ASA CLASS:  Class I     INDICATIONS:  abdominal pain, chest pain           MEDICATIONS:   Versed 8 mg, Fentanyl 75 mcg     TOPICAL ANESTHETIC:  Exactacain Spray           DESCRIPTION OF PROCEDURE:   After the risks benefits and     alternatives of the procedure were thoroughly explained, informed     consent was obtained.  The [facility] [id number] endoscope was     introduced through the mouth and advanced to the second portion of     the duodenum, without limitations.  The instrument was slowly     withdrawn as the mucosa was fully examined.     <<PROCEDUREIMAGES>>           Esophagitis was found in the distal esophagus. Grade 1     esophagitis, 2 short erosions With standard forceps, a biopsy was     obtained and sent to pathology (see image9, image8, and image7).     A hiatal hernia was found (see image10 and image6). reducible 3 cm     hiatal hernia  Mild gastritis was found. 3 short erosions in the     pyloric channel With standard forceps, a biopsy was obtained and     sent to pathology (see image2, image4, and image5).  Otherwise the     examination was normal (see image3).    Retroflexed views revealed     no abnormalities.    The scope was then withdrawn from the patient     and the procedure completed.           COMPLICATIONS:  None           ENDOSCOPIC IMPRESSION:     1) Esophagitis in the distal esophagus     2) Hiatal hernia  3) Mild gastritis     4) Otherwise normal examination     chest and abdominal pain likely due to GERD     RECOMMENDATIONS:     1) Await biopsy results     increase Protonix to 40 mg po bid, #60, 3 refills           REPEAT EXAM:  In 0 year(s) for.           ______________________________     Hedwig Morton. Juanda Chance, MD           CC:           n.     eSIGNED:   Hedwig Morton. Casmira Cramer at 06/06/2009 02:50 PM  Cynthia Strickland, Cynthia Strickland, [id number]  Note: An exclamation mark (!) indicates a result that was not dispersed into the flowsheet. Document Creation Date: 06/06/2009 2:51 PM _______________________________________________________________________  (1) Order result status: Final Collection or observation date-time: 06/06/2009 14:21 Requested date-time:  Receipt date-time:  Reported date-time:  Referring Physician:   Ordering Physician: Lina Sar [phone]) Specimen Source:  Source: Launa Grill Order Number: [id number] Lab site:   Appended Document: Upper Endoscopy     Procedures Next Due Date:    EGD: 05/2011

## 2010-05-02 NOTE — Progress Notes (Signed)
Summary: lab work same day   Phone Note Call from Patient Call back at [employer] [phone]   Caller: Patient Reason for Call: Refill Medication, Talk to Nurse, Lab or Test Results Summary of Call: pt would like to have fasting lab work on same day when she see dr. Shirlee Latch Initial call taken by: Lorne Skeens,  March 06, 2010 12:21 PM  Follow-up for Phone Call        [facility] Cynthia Dung, RN, BSN  March 06, 2010 2:23 PM --I have been unable to reach pt by telephone--if she hasn't had her lipid profile checked within the last 6 months she can  have a lipid/liver profile the morning of the appt Cynthia Strickland --I talked with pt by telephone--she will have fasting lipid/liver profile done at appt with Dr Shirlee Latch 05/15/10

## 2010-05-15 ENCOUNTER — Ambulatory Visit: Payer: Self-pay | Admitting: Cardiology

## 2010-05-15 ENCOUNTER — Other Ambulatory Visit: Payer: Self-pay

## 2010-06-13 ENCOUNTER — Encounter: Payer: Self-pay | Admitting: Cardiology

## 2010-06-13 ENCOUNTER — Ambulatory Visit (INDEPENDENT_AMBULATORY_CARE_PROVIDER_SITE_OTHER): Payer: Self-pay | Admitting: Cardiology

## 2010-06-13 ENCOUNTER — Other Ambulatory Visit: Payer: Self-pay | Admitting: Cardiology

## 2010-06-13 ENCOUNTER — Other Ambulatory Visit (INDEPENDENT_AMBULATORY_CARE_PROVIDER_SITE_OTHER): Payer: Commercial Managed Care - PPO

## 2010-06-13 DIAGNOSIS — R079 Chest pain, unspecified: Secondary | ICD-10-CM

## 2010-06-13 DIAGNOSIS — Z79899 Other long term (current) drug therapy: Secondary | ICD-10-CM

## 2010-06-13 DIAGNOSIS — E785 Hyperlipidemia, unspecified: Secondary | ICD-10-CM

## 2010-06-13 LAB — LIPID PANEL
Cholesterol: 151 mg/dL (ref 0–200)
LDL Cholesterol: 98 mg/dL (ref 0–99)
VLDL: 16.4 mg/dL (ref 0.0–40.0)

## 2010-06-13 LAB — HEPATIC FUNCTION PANEL
ALT: 15 U/L (ref 0–35)
AST: 18 U/L (ref 0–37)
Albumin: 4.5 g/dL (ref 3.5–5.2)
Alkaline Phosphatase: 61 U/L (ref 39–117)
Bilirubin, Direct: 0.1 mg/dL (ref 0.0–0.3)

## 2010-06-18 ENCOUNTER — Encounter: Payer: Self-pay | Admitting: Cardiology

## 2010-06-18 NOTE — Assessment & Plan Note (Signed)
Summary: F1Y/FROM CHECK OUT 05-09-09/AMD   Referring Provider:  n/a Primary Provider:  n/a  CC:  chest pressure  and headches pt states it may be related to anxitey.  History of Present Illness: 60 yo with history of hyperlipidemia and GERD returns for followup.  She was seen last year and had a stress echo done due to atypical chest pain.  This was a normal study.  I thought that GERD was the most likely cause of her symptoms.  She started taking Protonix 40 two times a day, and her chest pain, cough, and hoarseness resolved.  She was, of note, found to have Barrett's esophagus.  She has been doing well recently.  No exertional dyspnea or chest pain.  Main complaint is stress headaches.    ECG: NSR, normal  Labs (2/11): LDL 81, HDL 39  Current Medications (verified): 1)  Simvastatin 20 Mg Tabs (Simvastatin) .... Once Daily 2)  Lorazepam 0.5 Mg Tabs (Lorazepam) .... As Needed 3)  Aspirin 81 Mg Tbec (Aspirin) .Marland Kitchen.. 1 Tab When Pt Remebers 4)  Vitamin D 2000 Unit Tabs (Cholecalciferol) .... Take 1 Tablet By Mouth Once A Day 5)  Citracal Plus  Tabs (Multiple Minerals-Vitamins) .... Take 1 Tablet By Mouth Once A Day 6)  Protonix 40 Mg  Tbec (Pantoprazole Sodium) .Marland Kitchen.. 1 Twice A Day 30 Minutes Before Meals  Allergies: 1)  ! Sulfa 2)  Codeine  Past History:  Past Medical History: 1. Hyperlipidemia 2. GERD 3. Mild obesity 4. Anxiety Disorder 5. Fibromyalgia ? 6. Atypical chest pain: Stress echo (3/11) was normal  Family History: Reviewed history from 07/06/2009 and no changes required. Father with MI at age 30 but had angina prior.  Several uncles with MIs in their 77s and 54s.  Family History of Uterine Cancer: Mother Family History of Pancreatic Cancer: MGM No FH of Colon Cancer:  Social History: Reviewed history from 06/04/2009 and no changes required. 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  Patient is a former smoker. -stopped in 1989 Daily Caffeine  Use 1/day  Vital Signs:  Patient profile:   60 year old female Height:      60 inches Weight:      140 pounds BMI:     27.44 Pulse rate:   74 / minute Resp:     12 per minute BP sitting:   125 / 75  (left arm)  Vitals Entered By: Kem Parkinson (June 13, 2010 8:36 AM)  Physical Exam  General:  Well developed, well nourished, in no acute distress. Neck:  Neck supple, no JVD. No masses, thyromegaly or abnormal cervical nodes. Lungs:  Clear bilaterally to auscultation and percussion. Heart:  Non-displaced PMI, chest non-tender; regular rate and rhythm, S1, S2 without murmurs, rubs or gallops. Carotid upstroke normal, no bruit. Pedals normal pulses. No edema, no varicosities. Abdomen:  Bowel sounds positive; abdomen soft and non-tender without masses, organomegaly, or hernias noted. No hepatosplenomegaly. Extremities:  No clubbing or cyanosis. Neurologic:  Alert and oriented x 3. Psych:  Normal affect.   Impression & Recommendations:  Problem # 1:  CHEST PAIN (ICD-786.50) Atypical chest pain was likely due to reflux as symptoms are much improved with Protonix.  Stress echo was normal in 3/11.    Problem # 2:  HYPERLIPIDEMIA (ICD-272.4) Will check lipids/LFTs.   Patient Instructions: 1)  Your physician recommends that you return for a FASTING lipid profile/liver profile 786.50  2)  Your physician wants you to  follow-up in: 1year with Dr Marca Ancona.[facility] 2013)  You will receive a reminder letter in the mail two months in advance. If you don't receive a letter, please call our office to schedule the follow-up appointment.

## 2010-06-19 ENCOUNTER — Telehealth: Payer: Self-pay | Admitting: Cardiology

## 2010-06-19 NOTE — Telephone Encounter (Signed)
I spoke with the pt and made her aware of 06/13/10 lipid and liver results. I made her aware that Thurston Hole RN mailed a Lipid letter to her home.

## 2010-06-27 NOTE — Letter (Signed)
Summary: Custom - Lipid  [facility], Main Office  [address]   [place], [place] [address]   Phone: [phone]  Fax: [phone]     June 18, 2010 MRN: [id number]   Cynthia Strickland [address] [place], [place]  [address]   Dear Ms. Lungren,  Dr Shirlee Latch has reviewed  your cholesterol results.  They are as follows:     Total Cholesterol:    151 (Desirable: less than 200)       HDL  Cholesterol:     36.30  (Desirable: greater than 40 for men and 50 for women)       LDL Cholesterol:       98  (Desirable: less than 100 for low risk and less than 70 for moderate to high risk)       Triglycerides:       82.0  (Desirable: less than 150)  His  recommendations include: no new recommendations. Your  cholesterol is OK.   Call our office at the number listed above if you have any questions.  Lowering your LDL cholesterol is important, but it is only one of a large number of "risk factors" that may indicate that you are at risk for heart disease, stroke or other complications of hardening of the arteries.  Other risk factors include:   A.  Cigarette Smoking* B.  High Blood Pressure* C.  Obesity* D.   Low HDL Cholesterol (see yours above)* E.   Diabetes Mellitus (higher risk if your is uncontrolled) F.  Family history of premature heart disease G.  Previous history of stroke or cardiovascular disease    *These are risk factors YOU HAVE CONTROL OVER.  For more information, visit .  There is now evidence that lowering the TOTAL CHOLESTEROL AND LDL CHOLESTEROL can reduce the risk of heart disease.  The American Heart Association recommends the following guidelines for the treatment of elevated cholesterol:  1.  If there is now current heart disease and less than two risk factors, TOTAL CHOLESTEROL should be less than 200 and LDL CHOLESTEROL should be less than 100. 2.  If there is current heart disease or two or more risk factors, TOTAL CHOLESTEROL should be  less than 200 and LDL CHOLESTEROL should be less than 70.  A diet low in cholesterol, saturated fat, and calories is the cornerstone of treatment for elevated cholesterol.  Cessation of smoking and exercise are also important in the management of elevated cholesterol and preventing vascular disease.  Studies have shown that 30 to 60 minutes of physical activity most days can help lower blood pressure, lower cholesterol, and keep your weight at a healthy level.  Drug therapy is used when cholesterol levels do not respond to therapeutic lifestyle changes (smoking cessation, diet, and exercise) and remains unacceptably high.  If medication is started, it is important to have you levels checked periodically to evaluate the need for further treatment options.  Thank you,    Luana Shu

## 2010-07-01 LAB — CBC
Hemoglobin: 14.8 g/dL (ref 12.0–15.0)
MCV: 88.6 fL (ref 78.0–100.0)
RBC: 4.96 MIL/uL (ref 3.87–5.11)
RDW: 12.1 % (ref 11.5–15.5)
WBC: 9 10*3/uL (ref 4.0–10.5)

## 2010-07-01 LAB — URINALYSIS, ROUTINE W REFLEX MICROSCOPIC
Bilirubin Urine: NEGATIVE
Glucose, UA: NEGATIVE mg/dL
Ketones, ur: 15 mg/dL — AB
Nitrite: NEGATIVE
Protein, ur: NEGATIVE mg/dL
Specific Gravity, Urine: 1.022 (ref 1.005–1.030)
Urobilinogen, UA: 0.2 mg/dL (ref 0.0–1.0)
pH: 7.5 (ref 5.0–8.0)

## 2010-07-01 LAB — DIFFERENTIAL
Basophils Absolute: 0 10*3/uL (ref 0.0–0.1)
Lymphocytes Relative: 18 % (ref 12–46)
Monocytes Relative: 5 % (ref 3–12)

## 2010-07-01 LAB — COMPREHENSIVE METABOLIC PANEL
GFR calc Af Amer: >60 mL/min (ref >60)
GFR calc non Af Amer: >60 mL/min (ref >60)
Glucose, Bld: 96 mg/dL (ref 70–99)
Potassium: 4.5 mEq/L (ref 3.5–5.1)
Sodium: 145 mEq/L (ref 135–145)

## 2010-07-22 ENCOUNTER — Other Ambulatory Visit: Payer: Self-pay | Admitting: Cardiology

## 2010-08-16 NOTE — Op Note (Signed)
   NAME:  Cynthia Strickland, Cynthia Strickland                       ACCOUNT NO.:  [id number]   MEDICAL RECORD NO.:  [id number]                   PATIENT TYPE:  AMB   LOCATION:  ENDO                                 FACILITY:  [facility]   PHYSICIAN:  Lina Sar, M.D. [facility]               DATE OF BIRTH:  09-Mar-1951   DATE OF PROCEDURE:  DATE OF DISCHARGE:                                 OPERATIVE REPORT   PROCEDURE:  Colonoscopy.   HISTORY:  This 60 year old white female has experienced crampy lower  abdominal pain and change in her bowel habits.  She has been rather  constipated recently.  She gives history of symptomatic hemorrhoids.  Her  physical exam on 07/22/02 showed hemoccult-negative stool.  She was started  on high-fiber diet, Zelnorm 6 mg p.o. q.d. and Metamucil with some initial  improvement in her symptoms.  She is undergoing colonoscopy for neoplastic  screening.   ENDOSCOPE:  Olympus single-channel endoscope.   SEDATION:  Versed 7.5 mg, IV Demerol 100 mg IV.   PROCEDURE:  Olympus single-channel video endoscope passed under direct  vision through posterior rectum to the sigmoid colon.  Patient was monitored  by pulse oximeter.  Oxygen saturations were normal.  Anal canal and rectum  were both unremarkable.  Sigmoid colon was straight without tortuosity.  There were no diverticuli and folds are normal.  Colonoscope passed easily  through the splenic flexure, transverse colon, hepatic flexure, ascending  colon to the cecum.  Appendiceal  opening areas throughout appeared normal.  After reaching the cecum, colonoscope was slowly retracted, and colon  decompressed. There were no abnormalities.   IMPRESSION:  Normal colonoscopy to the cecum.   PLAN:  1. High-fiber diet.  2. Continue Zelnorm 6 mg daily.  3. Repeat colonoscopy in 10 years.                                               Lina Sar, M.D. [facility]    DB/MEDQ  D:  07/28/2002  T:  07/28/2002  Job:  [id number]   cc:   Merlene Laughter,  M.D.

## 2010-09-13 ENCOUNTER — Telehealth: Payer: Self-pay | Admitting: Internal Medicine

## 2010-09-13 MED ORDER — SUCRALFATE 1 GM/10ML PO SUSP: ORAL | Status: DC

## 2010-09-13 NOTE — Telephone Encounter (Signed)
Patient calling to report starting this spring, she has been waking up hoarse and with a sore throat. She thought it was because of the season but it has not gotten any better. Hoarseness gets better during the day. She is taking Protonix 40 mg BID for Barrett's esophagus. Please, advise.

## 2010-09-13 NOTE — Telephone Encounter (Signed)
Last EGD 05/2009, Barrett's esophagus. Please call in Carafate 12 oz, 10 cc po qid x 2 days then bid, 2 refills.

## 2010-09-13 NOTE — Telephone Encounter (Signed)
Rx sent to pharmacy. Left a message for patient at her home number that rx was sent.

## 2010-09-13 NOTE — Telephone Encounter (Signed)
Called patient at her work number and the office is now closed. Left a message at her home number for patient to call me

## 2010-09-16 NOTE — Telephone Encounter (Signed)
Spoke with patient. She has started the Carafate and will continue Protonix. She will call in a few weeks with update.

## 2010-10-18 ENCOUNTER — Other Ambulatory Visit: Payer: Self-pay | Admitting: Internal Medicine

## 2011-02-26 ENCOUNTER — Other Ambulatory Visit: Payer: Self-pay | Admitting: Internal Medicine

## 2011-03-15 IMAGING — CR DG CHEST 2V
2 series · 2 of 2 positions shown · non-contrast
Comparison: CT chest of 10/25/2007

CLINICAL DATA: Mid anterior chest pain, nausea and vomiting

CHEST - 2 VIEW

[w chest pa]
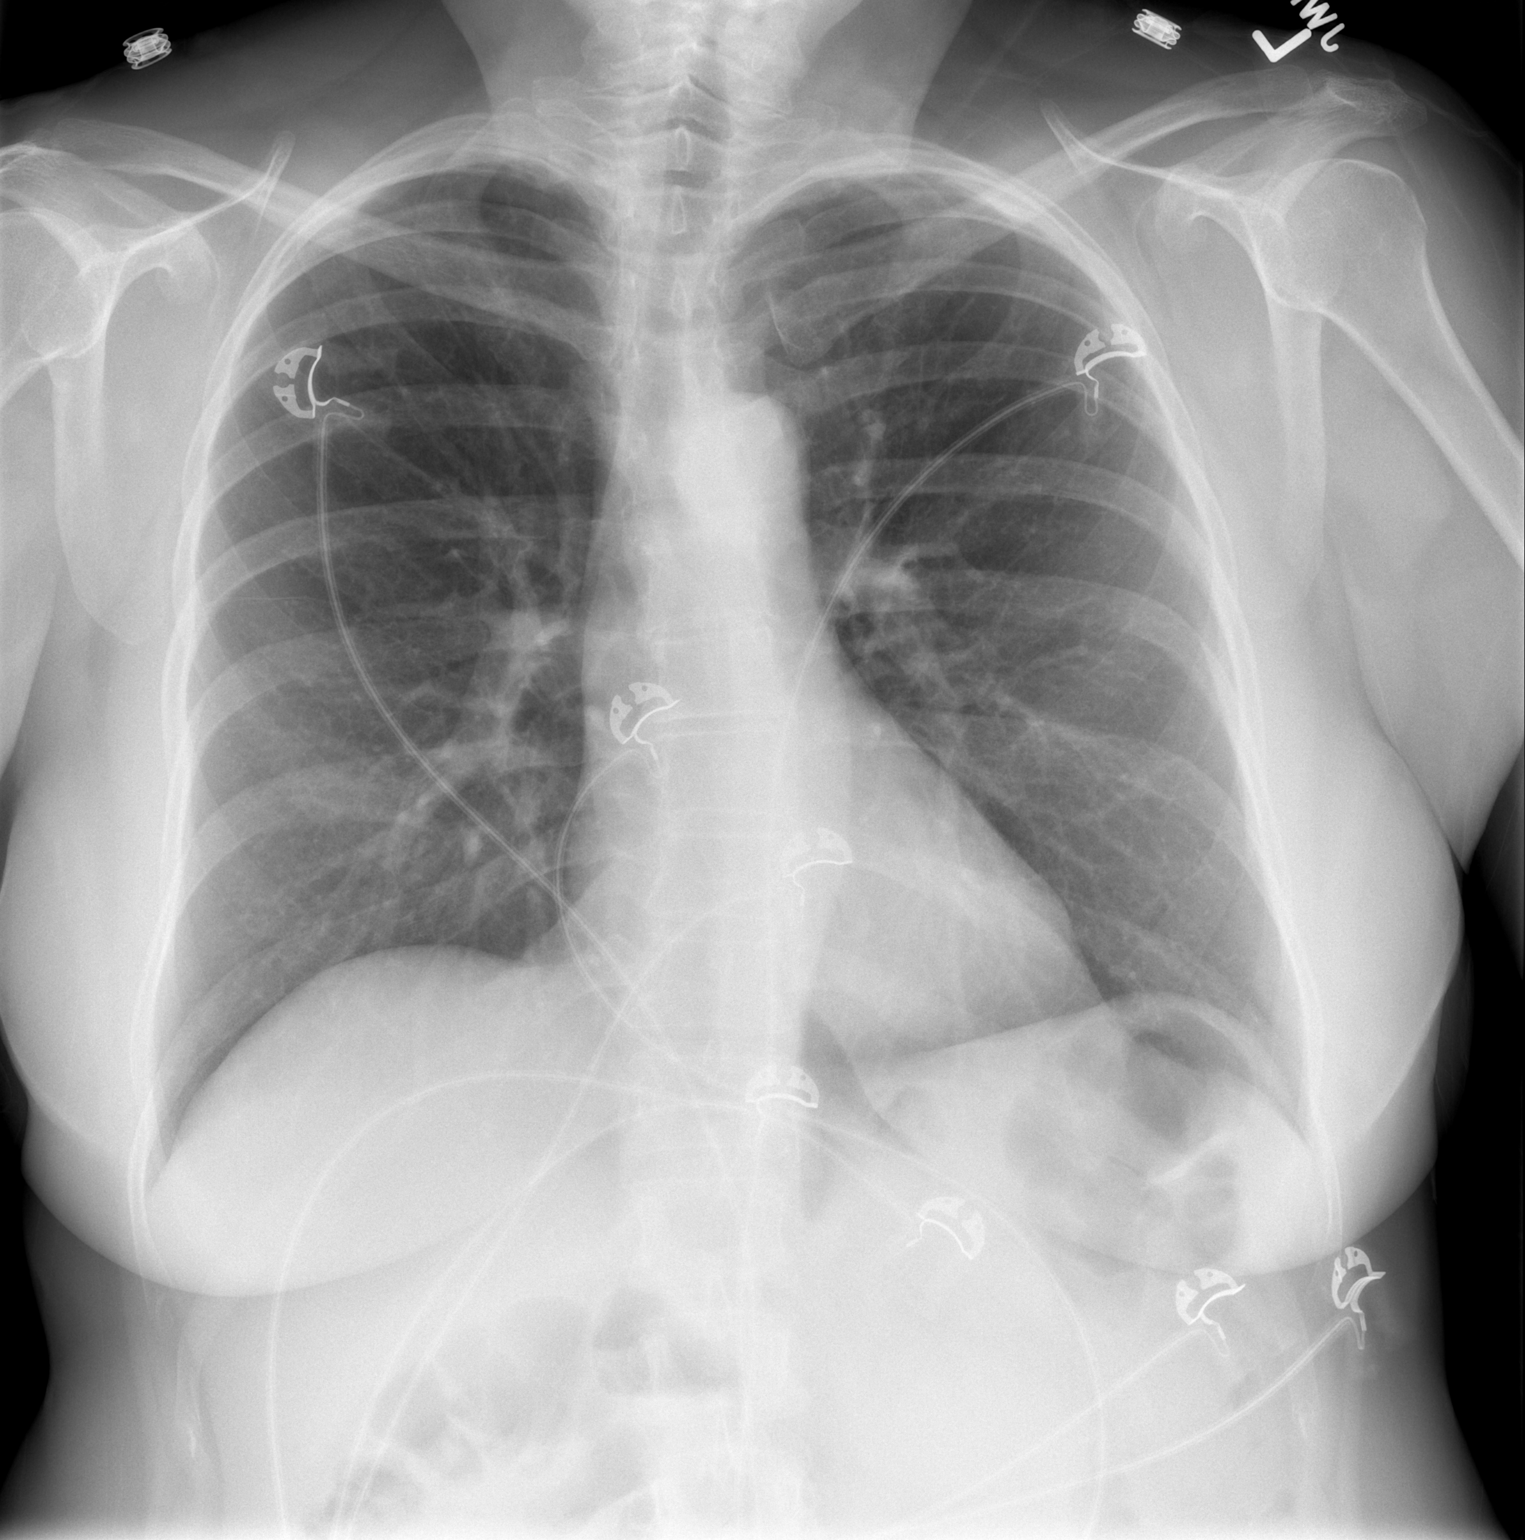

[w chest lat]
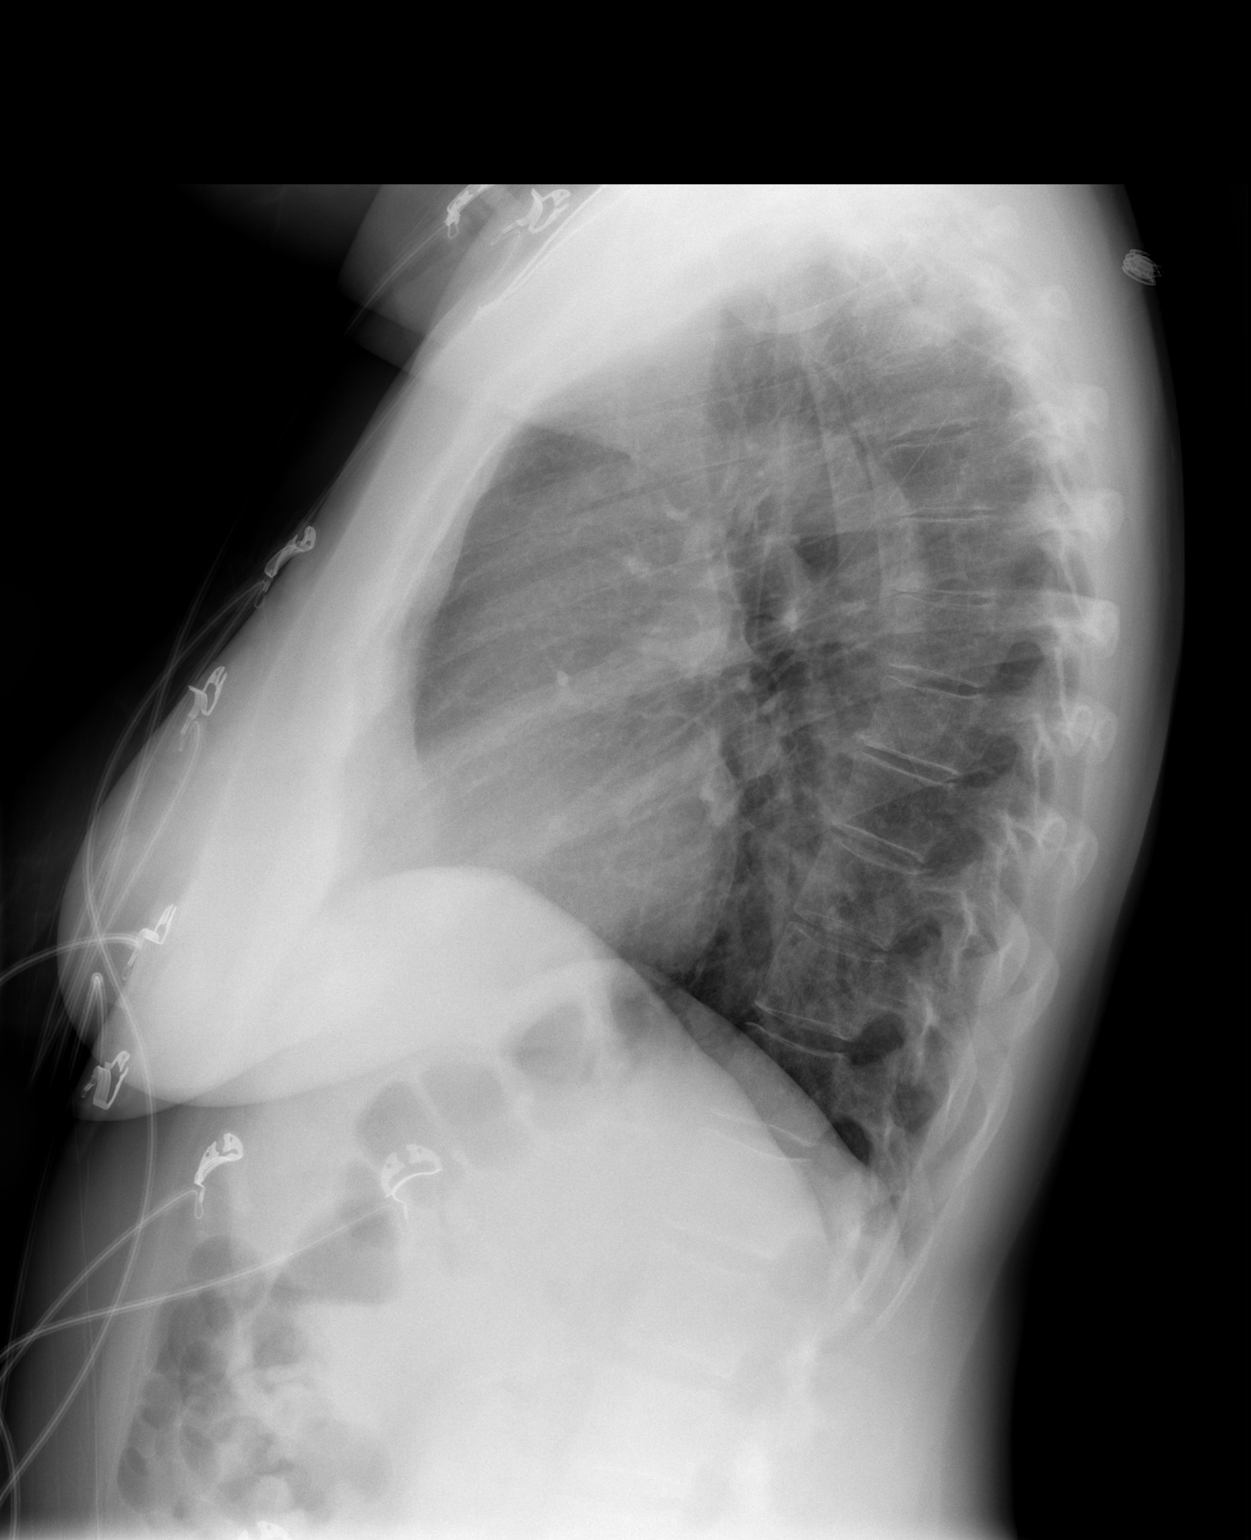

[2 of 2 positions shown; findings below may reference images not displayed]

FINDINGS: The lungs are clear.  The heart is within normal limits
in size.  No bony abnormality is seen.
IMPRESSION: No active lung disease.

## 2011-04-30 ENCOUNTER — Encounter: Payer: Self-pay | Admitting: Internal Medicine

## 2011-05-12 ENCOUNTER — Encounter: Payer: Self-pay | Admitting: Cardiology

## 2011-05-14 ENCOUNTER — Telehealth: Payer: Self-pay | Admitting: Cardiology

## 2011-05-14 ENCOUNTER — Other Ambulatory Visit: Payer: Self-pay | Admitting: *Deleted

## 2011-05-14 NOTE — Telephone Encounter (Signed)
Talked with pt. Last appt with Dr Shirlee Latch 05/09/09. Pt had stress echo 05/29/09.

## 2011-05-14 NOTE — Telephone Encounter (Signed)
Pt states neurologist may want her to have a echo bubble study. Pt is aware that could be scheduled to be done here.

## 2011-05-14 NOTE — Telephone Encounter (Signed)
Pt needs to know what studies she has had in the past. Pt is seeing a Neuro MD for headaches and they want her to have a bubble echo and she was wondering if she had something like that before

## 2011-05-19 ENCOUNTER — Encounter: Payer: Self-pay | Admitting: Cardiology

## 2011-05-19 DIAGNOSIS — I340 Nonrheumatic mitral (valve) insufficiency: Secondary | ICD-10-CM

## 2011-05-19 HISTORY — DX: Nonrheumatic mitral (valve) insufficiency: I34.0

## 2011-05-30 ENCOUNTER — Ambulatory Visit: Payer: Commercial Managed Care - PPO | Admitting: Cardiology

## 2011-05-30 ENCOUNTER — Ambulatory Visit (AMBULATORY_SURGERY_CENTER): Payer: Commercial Managed Care - PPO | Admitting: *Deleted

## 2011-05-30 VITALS — Ht 59.5 in | Wt 147.5 lb

## 2011-05-30 DIAGNOSIS — K2270 Barrett's esophagus without dysplasia: Secondary | ICD-10-CM

## 2011-06-13 ENCOUNTER — Encounter: Payer: Self-pay | Admitting: Internal Medicine

## 2011-06-13 ENCOUNTER — Ambulatory Visit (AMBULATORY_SURGERY_CENTER): Payer: Commercial Managed Care - PPO | Admitting: Internal Medicine

## 2011-06-13 VITALS — BP 164/98 | HR 78 | Temp 97.1°F | Resp 20 | Ht 59.0 in | Wt 147.0 lb

## 2011-06-13 DIAGNOSIS — K2270 Barrett's esophagus without dysplasia: Secondary | ICD-10-CM

## 2011-06-13 MED ORDER — SUCRALFATE 1 GM/10ML PO SUSP: 1.0000 g | Freq: Four times a day (QID) | ORAL | Status: DC

## 2011-06-13 NOTE — Op Note (Signed)
[facility] [address] [place], [place]  [address]  ENDOSCOPY PROCEDURE REPORT  PATIENT:  Cynthia, Strickland  MR#:  [id number] BIRTHDATE:  02-Feb-1951, 60 yrs. old  GENDER:  female  ENDOSCOPIST:  Hedwig Morton. Juanda Chance, MD Referred by:  Varney Baas, M.D.  PROCEDURE DATE:  06/13/2011 PROCEDURE:  EGD with biopsy, 43239 ASA CLASS:  Class I INDICATIONS:  h/o Barrett's Esophagus 05/2009- EGD- intestinal metaplasia, currently controlled on Protonix 40 mg po bid  MEDICATIONS:   MAC sedation, administered by CRNA, propofol (Diprivan) 180 mg TOPICAL ANESTHETIC:  none  DESCRIPTION OF PROCEDURE:   After the risks benefits and alternatives of the procedure were thoroughly explained, informed consent was obtained.  The LB [id number] endoscope was introduced through the mouth and advanced to the second portion of the duodenum, without limitations.  The instrument was slowly withdrawn as the mucosa was fully examined. <<PROCEDUREIMAGES>>  A hiatal hernia was found. reducible 2 cm hiatal hernia, no stricture, no esophagitis With standard forceps, a biopsy was obtained and sent to pathology (see image1 and image8).  irregular Z-line (see image7).  Otherwise the examination was normal (see image6, image5, image4, and image3).    Retroflexed views revealed no abnormalities.    The scope was then withdrawn from the patient and the procedure completed.  COMPLICATIONS:  None  ENDOSCOPIC IMPRESSION: 1) Hiatal hernia 2) Irregular Z-line 3) Otherwise normal examination since last exam 2011 there has been a complete esolution of esophagitis RECOMMENDATIONS: 1) Await biopsy results 2) Anti-reflux regimen to be follow Protonix 40 mg po bid, may try to reduce to 1 po qd if it works, otherwise stay on bid Carafate slurry use prn flare up of GERD  REPEAT EXAM:  In 2 year(s) for.  ______________________________ Hedwig Morton. Juanda Chance, MD  CC:  n. eSIGNED:   Hedwig Morton. Ronan Duecker at 06/13/2011  10:05 AM  Donalynn Furlong, [phone]

## 2011-06-13 NOTE — Patient Instructions (Addendum)

## 2011-06-13 NOTE — Progress Notes (Signed)
Patient did not experience any of the following events: a burn prior to discharge; a fall within the facility; wrong site/side/patient/procedure/implant event; or a hospital transfer or hospital admission upon discharge from the facility. [id number]) Patient did not have preoperative order for IV antibiotic SSI prophylaxis. (U0454)   Gave hiatal hernia sheet, rx for carafate prior to discharge

## 2011-06-16 ENCOUNTER — Telehealth: Payer: Self-pay | Admitting: *Deleted

## 2011-06-16 NOTE — Telephone Encounter (Signed)
No answer, message left for patient at number provided.

## 2011-06-17 ENCOUNTER — Encounter: Payer: Self-pay | Admitting: Internal Medicine

## 2011-06-24 ENCOUNTER — Other Ambulatory Visit: Payer: Self-pay | Admitting: Cardiology

## 2011-06-25 ENCOUNTER — Ambulatory Visit: Payer: Commercial Managed Care - PPO | Admitting: Cardiology

## 2011-07-29 ENCOUNTER — Encounter: Payer: Self-pay | Admitting: Cardiology

## 2011-07-29 ENCOUNTER — Ambulatory Visit (INDEPENDENT_AMBULATORY_CARE_PROVIDER_SITE_OTHER): Payer: Commercial Managed Care - PPO | Admitting: Cardiology

## 2011-07-29 VITALS — BP 130/84 | HR 63 | Ht 60.0 in | Wt 147.0 lb

## 2011-07-29 DIAGNOSIS — Q211 Atrial septal defect: Secondary | ICD-10-CM | POA: Insufficient documentation

## 2011-07-29 DIAGNOSIS — E785 Hyperlipidemia, unspecified: Secondary | ICD-10-CM

## 2011-07-29 DIAGNOSIS — R079 Chest pain, unspecified: Secondary | ICD-10-CM

## 2011-07-29 DIAGNOSIS — I6350 Cerebral infarction due to unspecified occlusion or stenosis of unspecified cerebral artery: Secondary | ICD-10-CM

## 2011-07-29 DIAGNOSIS — I639 Cerebral infarction, unspecified: Secondary | ICD-10-CM

## 2011-07-29 DIAGNOSIS — Q2112 Patent foramen ovale: Secondary | ICD-10-CM | POA: Insufficient documentation

## 2011-07-29 MED ORDER — SIMVASTATIN 20 MG PO TABS: 20.0000 mg | ORAL_TABLET | Freq: Every day | ORAL | Status: DC

## 2011-07-29 NOTE — Progress Notes (Signed)
PCP: Dr. Varney Baas  61 yo with history of hyperlipidemia and GERD returns for followup. She was seen in 2011 and had a stress echo done due to atypical chest pain. This was a normal study. I thought that GERD was the most likely cause of her symptoms. She started taking a PPI, and her chest pain, cough, and hoarseness resolved. She was, of note, found to have Barrett's esophagus. She has been doing well recently. No exertional dyspnea or chest pain.   She continues to have migraines with aura (numbness on the side of her face).  She saw a neurologist at [facility] and had a head MRI done, showing evidence for small vessel disease but no evidence for discrete CVA.  Echo with bubble study was suggestive of PFO.  She asks about PFO closure.   ECG: NSR, normal   Labs (2/11): LDL 81, HDL 39   Allergies:  1) ! Sulfa  2) Codeine   Past Medical History:  1. Hyperlipidemia  2. GERD  3. Mild obesity  4. Anxiety Disorder  5. Fibromyalgia ?  6. Atypical chest pain: Stress echo (3/11) was normal  7. Migraine headaches 8. PFO: Echo with EF > 60%, positive bubble study suggesting PFO.   Family History:  Father with MI at age 4 but had angina prior. Several uncles with MIs in their 31s and 19s.  Family History of Uterine Cancer: Mother  Family History of Pancreatic Cancer: MGM  CVAs No FH of Colon Cancer  Social History:  married, 1 boy  She works in an [employer].  Rare ETOH.  Patient is a former smoker. -stopped in 1989  Daily Caffeine Use 1/day  ROS: All systems reviewed and negative except as per HPI.   Current Outpatient Prescriptions  Medication Sig Dispense Refill  . ALPRAZolam (XANAX) 0.5 MG tablet Take 0.5 mg by mouth at bedtime as needed.      Marland Kitchen aspirin 81 MG tablet Take 81 mg by mouth daily.      Marland Kitchen CALCIUM PO Take 1 tablet by mouth daily.      . Cholecalciferol (VITAMIN D) 2000 UNITS tablet Take 2,000 Units by mouth daily.      . pantoprazole (PROTONIX) 40 MG tablet  TAKE 1 TABLET TWICE DAILY 30 MINUTES BEFORE MEALS  60 tablet  3  . simvastatin (ZOCOR) 20 MG tablet Take 1 tablet (20 mg total) by mouth daily.  30 tablet  6  . Wheat Dextrin (BENEFIBER) POWD Take by mouth daily.      Marland Kitchen DISCONTD: simvastatin (ZOCOR) 20 MG tablet TAKE 1 TABLET EVERY DAY  30 tablet  1    BP 130/84  Pulse 63  Ht 5' (1.524 m)  Wt 147 lb (66.679 kg)  BMI 28.71 kg/m2 General: NAD Neck: No JVD, no thyromegaly or thyroid nodule.  Lungs: Clear to auscultation bilaterally with normal respiratory effort. CV: Nondisplaced PMI.  Heart regular S1/S2, no S3/S4, no murmur.  No peripheral edema.  No carotid bruit.  Normal pedal pulses.  Abdomen: Soft, nontender, no hepatosplenomegaly, no distention.  Neurologic: Alert and oriented x 3.  Psych: Normal affect. Extremities: No clubbing or cyanosis.

## 2011-07-29 NOTE — Assessment & Plan Note (Signed)
Probably due to GERD.  Resolved with PPI.

## 2011-07-29 NOTE — Assessment & Plan Note (Signed)
Lipids done through her work recently.  She will send me a copy.

## 2011-07-29 NOTE — Patient Instructions (Signed)
Your physician has requested that you have a carotid duplex. This test is an ultrasound of the carotid arteries in your neck. It looks at blood flow through these arteries that supply the brain with blood. Allow one hour for this exam. There are no restrictions or special instructions.  Your physician wants you to follow-up in: 1 year with Dr Shirlee Latch. (April 2014).  You will receive a reminder letter in the mail two months in advance. If you don't receive a letter, please call our office to schedule the follow-up appointment.

## 2011-07-29 NOTE — Assessment & Plan Note (Signed)
Suspect PFO based on positive bubble study on TTE done through neurologist.  She has had stroke-like symptoms associated with her migraine that are consistent with migraine with aura.  MRI head was not normal but showed probable small vessel ischemic changes rather than prior CVA.  I do not think that she would be a candidate for PFO closure in any of the ongoing trials.  Agree with treatment with ASA 81.  Will also get carotid dopplers.

## 2011-07-30 ENCOUNTER — Telehealth: Payer: Self-pay | Admitting: Cardiology

## 2011-07-30 NOTE — Telephone Encounter (Signed)
Spoke with pt

## 2011-07-30 NOTE — Telephone Encounter (Signed)
New Problem:    Patient called returning your phone call. Please call back. 

## 2011-08-03 ENCOUNTER — Other Ambulatory Visit: Payer: Self-pay | Admitting: Internal Medicine

## 2011-08-08 ENCOUNTER — Encounter (INDEPENDENT_AMBULATORY_CARE_PROVIDER_SITE_OTHER): Payer: Commercial Managed Care - PPO

## 2011-08-08 DIAGNOSIS — I669 Occlusion and stenosis of unspecified cerebral artery: Secondary | ICD-10-CM

## 2011-08-08 DIAGNOSIS — I639 Cerebral infarction, unspecified: Secondary | ICD-10-CM

## 2011-08-12 ENCOUNTER — Telehealth: Payer: Self-pay | Admitting: Cardiology

## 2011-08-12 NOTE — Telephone Encounter (Signed)
Spoke with pt about recent carotid doppler results. 

## 2011-08-12 NOTE — Telephone Encounter (Signed)
New Problem: ° ° ° °Patient returned your call.  Please call back. °

## 2011-09-22 ENCOUNTER — Other Ambulatory Visit: Payer: Self-pay | Admitting: *Deleted

## 2011-09-22 DIAGNOSIS — E785 Hyperlipidemia, unspecified: Secondary | ICD-10-CM

## 2011-09-25 ENCOUNTER — Other Ambulatory Visit (INDEPENDENT_AMBULATORY_CARE_PROVIDER_SITE_OTHER): Payer: Commercial Managed Care - PPO

## 2011-09-25 DIAGNOSIS — E785 Hyperlipidemia, unspecified: Secondary | ICD-10-CM

## 2011-09-25 LAB — LIPID PANEL
Cholesterol: 147 mg/dL (ref 0–200)
VLDL: 17.4 mg/dL (ref 0.0–40.0)

## 2011-10-01 ENCOUNTER — Telehealth: Payer: Self-pay | Admitting: Cardiology

## 2011-10-01 NOTE — Telephone Encounter (Signed)
F/u   Patient returning call back to nurse Thurston Hole from yesterday.

## 2011-10-01 NOTE — Telephone Encounter (Signed)
Pt was notified of lipid results.

## 2011-10-03 ENCOUNTER — Telehealth: Payer: Self-pay | Admitting: Internal Medicine

## 2011-10-03 MED ORDER — HYDROCORTISONE ACETATE 25 MG RE SUPP: RECTAL | Status: DC

## 2011-10-03 MED ORDER — HYDROCORTISONE ACE-PRAMOXINE 2.5-1 % RE CREA: TOPICAL_CREAM | Freq: Two times a day (BID) | RECTAL | Status: AC

## 2011-10-03 NOTE — Telephone Encounter (Signed)
Patient calling to report she woke up yesterday with painful, bleeding hemorrhoids. She is using Preparation H which helped the bleeding but still has pain. Soak in tub of warm water/sitz bath 10 minutes BID, Tucs pads, meticulous cleaning between bowel movements reviewed with patient.  Last colon 06/06/09- internal hems. Ok to order Anusol supp. ? Please, advise.

## 2011-10-03 NOTE — Telephone Encounter (Signed)
OK to oreder Anusol HC supp #12, 1 refill, and Analpram cream 2.5% bid. Disp 15 gm, 1 refill

## 2011-10-03 NOTE — Telephone Encounter (Signed)
Rx sent to pharmacy. Left a message for patient to call me. 

## 2011-10-03 NOTE — Telephone Encounter (Signed)
Spoke with patient and gave her Dr. Brodie's recommendations. 

## 2012-02-12 ENCOUNTER — Other Ambulatory Visit: Payer: Self-pay | Admitting: *Deleted

## 2012-02-12 MED ORDER — PANTOPRAZOLE SODIUM 40 MG PO TBEC: 40.0000 mg | DELAYED_RELEASE_TABLET | Freq: Two times a day (BID) | ORAL | Status: DC

## 2012-11-08 ENCOUNTER — Ambulatory Visit: Payer: Commercial Managed Care - PPO | Admitting: Family Medicine

## 2012-12-27 ENCOUNTER — Other Ambulatory Visit: Payer: Self-pay | Admitting: Internal Medicine

## 2012-12-27 NOTE — Telephone Encounter (Signed)
PLEASE MAKE AN OFFICE VISIT FOR FURTHER REFILLS  

## 2012-12-29 DIAGNOSIS — M069 Rheumatoid arthritis, unspecified: Secondary | ICD-10-CM

## 2012-12-29 HISTORY — DX: Rheumatoid arthritis, unspecified: M06.9

## 2013-02-16 ENCOUNTER — Other Ambulatory Visit: Payer: Self-pay | Admitting: Internal Medicine

## 2013-04-29 ENCOUNTER — Encounter: Payer: Self-pay | Admitting: Internal Medicine

## 2013-06-08 ENCOUNTER — Encounter: Payer: Self-pay | Admitting: Internal Medicine

## 2013-07-04 ENCOUNTER — Other Ambulatory Visit: Payer: Self-pay | Admitting: Internal Medicine

## 2013-07-20 ENCOUNTER — Other Ambulatory Visit: Payer: Self-pay | Admitting: Internal Medicine

## 2013-07-22 ENCOUNTER — Ambulatory Visit (AMBULATORY_SURGERY_CENTER): Payer: Self-pay | Admitting: *Deleted

## 2013-07-22 VITALS — Ht 59.0 in | Wt 148.8 lb

## 2013-07-22 DIAGNOSIS — K2270 Barrett's esophagus without dysplasia: Secondary | ICD-10-CM

## 2013-07-22 NOTE — Progress Notes (Signed)
Denies allergies to eggs or soy products. Denies complications with sedation or anesthesia. Denies O2 use. Denies use of diet or weight loss medications.  Emmi instructions given for colonoscopy.  

## 2013-08-05 ENCOUNTER — Encounter: Payer: Self-pay | Admitting: Internal Medicine

## 2013-08-05 ENCOUNTER — Ambulatory Visit (AMBULATORY_SURGERY_CENTER): Payer: PRIVATE HEALTH INSURANCE | Admitting: Internal Medicine

## 2013-08-05 VITALS — BP 157/100 | HR 63 | Temp 98.5°F | Resp 13 | Ht 59.0 in | Wt 148.0 lb

## 2013-08-05 DIAGNOSIS — K2100 Gastro-esophageal reflux disease with esophagitis, without bleeding: Secondary | ICD-10-CM

## 2013-08-05 DIAGNOSIS — K2270 Barrett's esophagus without dysplasia: Secondary | ICD-10-CM

## 2013-08-05 MED ORDER — SODIUM CHLORIDE 0.9 % IV SOLN: 500.0000 mL | INTRAVENOUS | Status: DC

## 2013-08-05 MED ORDER — RANITIDINE HCL 150 MG PO TABS: 150.0000 mg | ORAL_TABLET | ORAL | Status: DC

## 2013-08-05 NOTE — Op Note (Signed)
[place]  [address]. [place], [address]   ENDOSCOPY PROCEDURE REPORT  PATIENT: Cynthia Strickland, Cynthia Strickland  MR#: [id number] BIRTHDATE: [birth year] , 62  yrs. old GENDER: Female ENDOSCOPIST: Lafayette Dragon, MD REFERRED BY:  Evette Cristal, M.D. PROCEDURE DATE:  08/05/2013 PROCEDURE:  EGD w/ biopsy ASA CLASS:     Class I INDICATIONS:  history of Barrett's esophagus.   Heartburn. Barrett's esophagus on upper endoscopy in March 2011.  Recurrent heartburn on Protonix40 mg twice a day. MEDICATIONS: MAC sedation, administered by CRNA and propofol (Diprivan) 200mg  IV TOPICAL ANESTHETIC: Cetacaine Spray  DESCRIPTION OF PROCEDURE: After the risks benefits and alternatives of the procedure were thoroughly explained, informed consent was obtained.  The LB [id number] endoscope was introduced through the mouth and advanced to the second portion of the duodenum. Without limitations.  The instrument was slowly withdrawn as the mucosa was fully examined.      Esophagus: Proximal and mid esophageal mucosa appeared normal. There were 2 small erosions at the GE junction as well as  nonobstructing esophageal fibrous ring which was traversed without resistance. Biopsies were taken from GE junction we'll followup on Barrett's esophagus Stomach: There was a 3 cm reducible hiatal hernia which extended from 32-35 cm from the incisors. Gastric folds were normal gastric antrum and pyloric outlet was normal. Retroflexion of the endoscope confirmed the presence of hiatal Duodenum duodenal bulb and descending duodenum was normal[ The scope was then withdrawn from the patient and the procedure completed.  COMPLICATIONS: There were no complications. ENDOSCOPIC IMPRESSION: history of Barrett's esophagus. Status post 4 biopsies Grade 1 esophagitis 3 cm nonreducible hiatal hernia Patient's symptoms are likely related to recurrent reflux and hiatal hernia. RECOMMENDATIONS:   await  pathology resultsStrict antireflux measures Continue Protonix 40 mg twice a day Add ranitidine 150 mg in the middle of the day If no improvement consider Nissen fundoplication  REPEAT EXAM: for EGD pending biopsy results.  eSigned:  Lafayette Dragon, MD 08/05/2013 11:28 AM   CC:  PATIENT NAME:  Cynthia Strickland, Cynthia Strickland MR#: [id number]

## 2013-08-05 NOTE — Progress Notes (Signed)
Procedure ends, to recovery, report given and VSS. 

## 2013-08-05 NOTE — Patient Instructions (Signed)

## 2013-08-08 ENCOUNTER — Telehealth: Payer: Self-pay

## 2013-08-08 NOTE — Telephone Encounter (Signed)
Left a message at # [phone] for the pt to call back if any questions or concerns. maw

## 2013-08-09 ENCOUNTER — Encounter: Payer: Self-pay | Admitting: Internal Medicine

## 2013-08-11 ENCOUNTER — Encounter: Payer: Self-pay | Admitting: *Deleted

## 2013-10-07 ENCOUNTER — Other Ambulatory Visit: Payer: Self-pay | Admitting: Internal Medicine

## 2014-07-14 ENCOUNTER — Other Ambulatory Visit: Payer: Self-pay | Admitting: Internal Medicine

## 2014-08-11 ENCOUNTER — Other Ambulatory Visit: Payer: Self-pay | Admitting: Internal Medicine

## 2014-09-09 ENCOUNTER — Other Ambulatory Visit: Payer: Self-pay | Admitting: Internal Medicine

## 2014-10-19 ENCOUNTER — Other Ambulatory Visit: Payer: Self-pay | Admitting: Obstetrics & Gynecology

## 2014-10-20 LAB — CYTOLOGY - PAP

## 2014-11-04 ENCOUNTER — Other Ambulatory Visit: Payer: Self-pay | Admitting: Internal Medicine

## 2014-11-06 ENCOUNTER — Other Ambulatory Visit: Payer: Self-pay | Admitting: Internal Medicine

## 2015-02-05 ENCOUNTER — Encounter: Payer: Self-pay | Admitting: Internal Medicine

## 2015-09-11 ENCOUNTER — Telehealth: Payer: Self-pay | Admitting: Internal Medicine

## 2015-09-11 NOTE — Telephone Encounter (Signed)
Left a message for patient to call back. 

## 2015-09-17 NOTE — Telephone Encounter (Signed)
Patient did not return call.

## 2015-10-03 ENCOUNTER — Telehealth: Payer: Self-pay | Admitting: Gastroenterology

## 2015-10-03 NOTE — Telephone Encounter (Signed)
Sent message to Dr. Havery Moros regarding this request.  She has an office visit with him in September

## 2015-10-04 ENCOUNTER — Telehealth: Payer: Self-pay

## 2015-10-04 MED ORDER — SUCRALFATE 1 GM/10ML PO SUSP: 1.0000 g | ORAL | Status: DC | PRN

## 2015-10-04 NOTE — Telephone Encounter (Signed)
Sent Carafate to pharmacy.  Patient needs to keep her appointment with Dr. Havery Moros in September

## 2015-10-04 NOTE — Telephone Encounter (Signed)
Sent Carafate per Dr. Havery Moros

## 2015-10-04 NOTE — Telephone Encounter (Signed)
-----   Message from Manus Gunning, MD sent at 10/03/2015  4:40 PM EDT ----- If she feels that it helps her you can give her some to take PRN. I will see her in clinic. Please give her the liquid formulation. Thanks  ----- Message -----    From: Audrea Muscat, CMA    Sent: 10/03/2015   3:23 PM      To: Manus Gunning, MD  Patient is requesting an rx for Carafate, which Dr. Olevia Perches has used for her reflux in the past.  Her last procedure was 07/2013.  She has an appointment scheduled with you in September.  Please advise.  :)

## 2015-11-05 ENCOUNTER — Telehealth: Payer: Self-pay | Admitting: Gastroenterology

## 2015-11-05 MED ORDER — PANTOPRAZOLE SODIUM 40 MG PO TBEC: 40.0000 mg | DELAYED_RELEASE_TABLET | Freq: Two times a day (BID) | ORAL | 0 refills | Status: DC

## 2015-11-05 NOTE — Telephone Encounter (Signed)
That's fine, I will see her on 9/6

## 2015-11-05 NOTE — Telephone Encounter (Signed)
Rx sent 

## 2015-11-05 NOTE — Telephone Encounter (Signed)
Ok to send, Dr Havery Moros?

## 2015-12-03 ENCOUNTER — Other Ambulatory Visit: Payer: Self-pay | Admitting: Gastroenterology

## 2015-12-05 ENCOUNTER — Ambulatory Visit (INDEPENDENT_AMBULATORY_CARE_PROVIDER_SITE_OTHER): Payer: PRIVATE HEALTH INSURANCE | Admitting: Gastroenterology

## 2015-12-05 ENCOUNTER — Encounter: Payer: Self-pay | Admitting: Gastroenterology

## 2015-12-05 VITALS — BP 132/96 | HR 84 | Ht 59.0 in | Wt 156.2 lb

## 2015-12-05 DIAGNOSIS — K2270 Barrett's esophagus without dysplasia: Secondary | ICD-10-CM | POA: Diagnosis not present

## 2015-12-05 DIAGNOSIS — K219 Gastro-esophageal reflux disease without esophagitis: Secondary | ICD-10-CM | POA: Diagnosis not present

## 2015-12-05 MED ORDER — PANTOPRAZOLE SODIUM 40 MG PO TBEC: 40.0000 mg | DELAYED_RELEASE_TABLET | Freq: Two times a day (BID) | ORAL | 0 refills | Status: DC

## 2015-12-05 MED ORDER — SUCRALFATE 1 GM/10ML PO SUSP: 1.0000 g | ORAL | 1 refills | Status: DC | PRN

## 2015-12-05 NOTE — Progress Notes (Signed)
HPI :  65 y/o female with a reported history of Barrett's and GERD, here for a follow up. She is a new patient to me.   She has a longstanding history of GERD, main symptom of pyrosis. She reports she has had symptoms longstanding. Severity has fluctuated over the years. She has been on protonix historically, but ran out of it this past summer. She had been on twice daily protonix, and and when she ran out of it, had severe worsening of symptoms - recurrence of pyrosis and hoarse voice. She has since resumed protonix 40mg  at twice daily and it has "90%" improved her symptoms, but still has some burning in her throat at times. She has been on twice daily for 2-3 weeks. She had been on off it completely for 1-2 months. She denies dysphagia. No odynophagia. No nausea or vomiting. Pyrosis is well controlled. She has used some carafate PRN which she thinks helps.   No family history of esophageal cancer. She smoked cigarettes historically, stopped in her 44s.. Mother had endometrial cancer.   She has had multiple endoscopies as outlined below. Short segment of BE noted in 2011 without dysplasia (irregular z-line).  Endoscopic history EGD 08/05/2013 - 3cm hiatal hernia, nonobstructing Shatski ring, irregular z-line - biopsies c/w Barrett's EGD 06/13/2011 - irregular z-line - biopsies c/w Barrett's esophagus EGD 06/06/2009 - esophagitis, small hiatal hernia, gastritis - biopses c/w Barrett's Colonoscopy 06/06/2009 - diverticulosis, hemorrhoids Colonoscopy 07/28/2002 - normal colonoscopy  Past Medical History:  Diagnosis Date  . Anxiety   . Barretts esophagus   . GERD (gastroesophageal reflux disease)   . Hiatal hernia   . Migraine headache with aura   . Mitral regurgitation 05-19-11    and Tricuspid Regurgitation(had Echo w/Bubble study)for migraines  . Rheumatoid arthritis ([place]) 12/2012     Past Surgical History:  Procedure Laterality Date  . CESAREAN SECTION  1976  . LAPAROSCOPIC OOPHORECTOMY   2005  . VAGINAL HYSTERECTOMY  1995   Family History  Problem Relation Age of Onset  . Colon cancer Neg Hx   . Stomach cancer Neg Hx   . Esophageal cancer Neg Hx   . Rectal cancer Neg Hx    Social History  Substance Use Topics  . Smoking status: Former Smoker    Years: 13.00    Types: Cigarettes    Quit date: 03/31/1993  . Smokeless tobacco: Never Used  . Alcohol use 1.2 oz/week    2 Glasses of wine per week   Current Outpatient Prescriptions  Medication Sig Dispense Refill  . Cholecalciferol (VITAMIN D) 2000 UNITS tablet Take 2,000 Units by mouth daily.    . CO ENZYME Q-10 PO Take 11 tablets by mouth daily.    . DULoxetine (CYMBALTA) 30 MG capsule Take 30 mg by mouth daily.    Marland Kitchen KRILL OIL PO Take 1 tablet by mouth daily.    . pantoprazole (PROTONIX) 40 MG tablet TAKE 1 TABLET BY MOUTH TWICE A DAY 60 tablet 4  . pantoprazole (PROTONIX) 40 MG tablet TAKE 1 TABLET (40 MG TOTAL) BY MOUTH 2 (TWO) TIMES DAILY. 60 tablet 0  . sucralfate (CARAFATE) 1 GM/10ML suspension Take 10 mLs (1 g total) by mouth as needed. 420 mL 1  . temazepam (RESTORIL) 7.5 MG capsule Take 7.5 mg by mouth at bedtime as needed.     No current facility-administered medications for this visit.    Allergies  Allergen Reactions  . Codeine Nausea Only  . Sulfonamide Derivatives Rash  Review of Systems: All systems reviewed and negative except where noted in HPI.    Lab Results  Component Value Date   CREATININE 0.7 03/20/2009   BUN 24 (H) 03/20/2009   NA 145 03/20/2009   K 4.5 SLIGHT HEMOLYSIS 03/20/2009   CL 104 03/20/2009   CO2 28 03/20/2009    Lab Results  Component Value Date   WBC 9.0 03/20/2009   HGB 14.8 03/20/2009   HCT 43.9 03/20/2009   MCV 88.6 03/20/2009   PLT 391 03/20/2009     Physical Exam: BP (!) 132/96 (BP Location: Left Arm, Patient Position: Sitting, Cuff Size: Normal)   Pulse 84   Ht 4\' 11"  (1.499 m) Comment: height measured without shoes  Wt 156 lb 4 oz (70.9 kg)    BMI 31.56 kg/m  Constitutional: Pleasant,well-developed, female in no acute distress. HEENT: Normocephalic and atraumatic. Conjunctivae are normal. No scleral icterus. Neck supple.  Cardiovascular: Normal rate, regular rhythm.  Pulmonary/chest: Effort normal and breath sounds normal. No wheezing, rales or rhonchi. Abdominal: Soft, nondistended, nontender. Bowel sounds active throughout. There are no masses palpable. No hepatomegaly. Extremities: no edema Lymphadenopathy: No cervical adenopathy noted. Neurological: Alert and oriented to person place and time. Skin: Skin is warm and dry. No rashes noted. Psychiatric: Normal mood and affect. Behavior is normal.   ASSESSMENT AND PLAN: 65 y/o female here for reassessment of the following issues:  GERD - longstanding symptoms. She has responded quite well to PPI in the past but had severe worsening of symptoms when she ran out this summer. Now with good control of pyrosis again on high dose protonix. We discussed long term risks / benefits of PPIs in general, in regards to the recent news reports. I have asked that we obtain her labs done recently from primary care to ensure normal renal function. Long term I recommend the lowest daily dose of PPI needed to control her symptoms. She will continue twice daily for a few more weeks until her throat symptoms have improved, and then will try once daily if it controls her symptoms. She can continue Carafate PRN if this helps for breakthrough, or use OTC Gaviscon PRN.   Barrett's esophagus - prior endoscopy reports reviewed, short segment of non-dysplastic Barrett's. The segment actually appears to be an irregular-zline. I discussed recent updates on Barrett's management per ACG guidelines since she was last seen. The risk of cancer development is extremely low from this lesion, and current guidelines no longer recommend even  biopsies of irregular z-line. That being said, she wishes to continue with  surveillance exams for this issue after a discussion about it. Recommend a repeat EGD 3 to 5 years from her last exam (due 2018 to 2020). She agreed with the plan.   Pillsbury Cellar, MD [facility] Gastroenterology Pager [phone]

## 2015-12-05 NOTE — Patient Instructions (Signed)
If you are age 63 or older, your body mass index should be between 23-30. Your Body mass index is 31.56 kg/m. If this is out of the aforementioned range listed, please consider follow up with your Primary Care Provider.  If you are age 1 or younger, your body mass index should be between 19-25. Your Body mass index is 31.56 kg/m. If this is out of the aformentioned range listed, please consider follow up with your Primary Care Provider.   Thank you for choosing [facility]  Dr Havery Moros

## 2016-02-01 ENCOUNTER — Other Ambulatory Visit: Payer: Self-pay | Admitting: Gastroenterology

## 2016-05-01 ENCOUNTER — Telehealth: Payer: Self-pay | Admitting: Gastroenterology

## 2016-05-01 NOTE — Telephone Encounter (Signed)
Yes we can book her for an EGD. I think I may have opening on Monday if you want to put her in that spot. She can add gaviscon PRN which is OTC if still having breakthrough symptoms. Thanks

## 2016-05-01 NOTE — Telephone Encounter (Signed)
Patient has had increase in her reflux, most foods causing her reflux. She is taking protonix 40 mg BID, carafate prn. Her ENT doctor prescribed zantac 150 mg qhs, still having problems. Looking at your last note she is due for EGD in May 2018. Do you want me to schedule this?

## 2016-05-02 NOTE — Telephone Encounter (Signed)
Spoke to patient, she cannot do an EGD next Monday. She is scheduled for 2/14. Has pre-visit appointment on 2/7. She understands to take gaviscon prn.

## 2016-05-03 ENCOUNTER — Encounter (HOSPITAL_BASED_OUTPATIENT_CLINIC_OR_DEPARTMENT_OTHER): Payer: Self-pay | Admitting: Emergency Medicine

## 2016-05-03 DIAGNOSIS — Z79899 Other long term (current) drug therapy: Secondary | ICD-10-CM | POA: Diagnosis not present

## 2016-05-03 DIAGNOSIS — R1011 Right upper quadrant pain: Secondary | ICD-10-CM | POA: Diagnosis not present

## 2016-05-03 DIAGNOSIS — M549 Dorsalgia, unspecified: Secondary | ICD-10-CM | POA: Diagnosis not present

## 2016-05-03 DIAGNOSIS — R112 Nausea with vomiting, unspecified: Secondary | ICD-10-CM | POA: Diagnosis not present

## 2016-05-03 DIAGNOSIS — R1013 Epigastric pain: Secondary | ICD-10-CM | POA: Insufficient documentation

## 2016-05-03 LAB — COMPREHENSIVE METABOLIC PANEL
ALT: 26 U/L (ref 14–54)
ANION GAP: 11 (ref 5–15)
AST: 28 U/L (ref 15–41)
Albumin: 5 g/dL (ref 3.5–5.0)
Alkaline Phosphatase: 66 U/L (ref 38–126)
BILIRUBIN TOTAL: 1.5 mg/dL — AB (ref 0.3–1.2)
BUN: 21 mg/dL — AB (ref 6–20)
CO2: 25 mmol/L (ref 22–32)
Calcium: 9.8 mg/dL (ref 8.9–10.3)
Chloride: 103 mmol/L (ref 101–111)
Creatinine, Ser: 0.73 mg/dL (ref 0.44–1.00)
GFR calc Af Amer: >60 mL/min (ref >60)
Glucose, Bld: 100 mg/dL — ABNORMAL HIGH (ref 65–99)
Potassium: 3.3 mmol/L — ABNORMAL LOW (ref 3.5–5.1)
Sodium: 139 mmol/L (ref 135–145)
TOTAL PROTEIN: 7.7 g/dL (ref 6.5–8.1)

## 2016-05-03 LAB — CBC
HCT: 43.3 % (ref 36.0–46.0)
Hemoglobin: 14.4 g/dL (ref 12.0–15.0)
MCH: 29.5 pg (ref 26.0–34.0)
MCHC: 33.3 g/dL (ref 30.0–36.0)
MCV: 88.7 fL (ref 78.0–100.0)
PLATELETS: 312 10*3/uL (ref 150–400)
RBC: 4.88 MIL/uL (ref 3.87–5.11)
RDW: 14.4 % (ref 11.5–15.5)
WBC: 7.9 10*3/uL (ref 4.0–10.5)

## 2016-05-03 LAB — LIPASE, BLOOD: Lipase: 40 U/L (ref 11–51)

## 2016-05-03 NOTE — ED Triage Notes (Signed)
Patient states that she woke up yesterday with bad reflux and since Thursday has not been able to eat. The patient has not even been able to take her pills. The patient reports that she is now having pain to her right shoulder and right shoulder aches. The patient reports that she is now having pain to her right chest  - N/V worsening tonight

## 2016-05-04 ENCOUNTER — Emergency Department (HOSPITAL_BASED_OUTPATIENT_CLINIC_OR_DEPARTMENT_OTHER)
Admission: EM | Admit: 2016-05-04 | Discharge: 2016-05-04 | Disposition: A | Discharge status: Home / Self Care | Payer: BLUE CROSS/BLUE SHIELD | Attending: Emergency Medicine | Admitting: Emergency Medicine

## 2016-05-04 ENCOUNTER — Emergency Department (HOSPITAL_BASED_OUTPATIENT_CLINIC_OR_DEPARTMENT_OTHER): Payer: BLUE CROSS/BLUE SHIELD

## 2016-05-04 DIAGNOSIS — R1011 Right upper quadrant pain: Secondary | ICD-10-CM

## 2016-05-04 DIAGNOSIS — R1013 Epigastric pain: Secondary | ICD-10-CM

## 2016-05-04 LAB — URINALYSIS, ROUTINE W REFLEX MICROSCOPIC
Bilirubin Urine: NEGATIVE
GLUCOSE, UA: NEGATIVE mg/dL
HGB URINE DIPSTICK: NEGATIVE
Ketones, ur: 15 mg/dL — AB
LEUKOCYTES UA: NEGATIVE
Nitrite: NEGATIVE
PROTEIN: NEGATIVE mg/dL
SPECIFIC GRAVITY, URINE: 1.045 — AB (ref 1.005–1.030)
pH: 6 (ref 5.0–8.0)

## 2016-05-04 LAB — TROPONIN I

## 2016-05-04 MED ORDER — ONDANSETRON 4 MG PO TBDP: ORAL_TABLET | ORAL | 0 refills | Status: DC

## 2016-05-04 MED ORDER — IOPAMIDOL (ISOVUE-300) INJECTION 61%
100.0000 mL | Freq: Once | INTRAVENOUS | Status: AC | PRN
  Administered 2016-05-04: 100 mL via INTRAVENOUS

## 2016-05-04 MED ORDER — DIPHENHYDRAMINE HCL 50 MG/ML IJ SOLN
25.0000 mg | Freq: Once | INTRAMUSCULAR | Status: AC
  Administered 2016-05-04: 25 mg via INTRAVENOUS
  Filled 2016-05-04: qty 1

## 2016-05-04 MED ORDER — GI COCKTAIL ~~LOC~~
30.0000 mL | Freq: Once | ORAL | Status: AC
  Administered 2016-05-04: 30 mL via ORAL
  Filled 2016-05-04: qty 30

## 2016-05-04 MED ORDER — ONDANSETRON HCL 4 MG/2ML IJ SOLN
4.0000 mg | Freq: Once | INTRAMUSCULAR | Status: AC
  Administered 2016-05-04: 4 mg via INTRAVENOUS
  Filled 2016-05-04: qty 2

## 2016-05-04 MED ORDER — PROCHLORPERAZINE EDISYLATE 5 MG/ML IJ SOLN
10.0000 mg | Freq: Once | INTRAMUSCULAR | Status: AC
  Administered 2016-05-04: 10 mg via INTRAVENOUS
  Filled 2016-05-04: qty 2

## 2016-05-04 MED ORDER — SODIUM CHLORIDE 0.9 % IV BOLUS (SEPSIS)
1000.0000 mL | Freq: Once | INTRAVENOUS | Status: AC
  Administered 2016-05-04: 1000 mL via INTRAVENOUS

## 2016-05-04 NOTE — ED Notes (Signed)
Pt walked to restroom 

## 2016-05-04 NOTE — ED Notes (Signed)
EDP into room, at BS.  ?

## 2016-05-04 NOTE — ED Notes (Signed)
Pt discharged to home with husband NAD.

## 2016-05-04 NOTE — ED Provider Notes (Signed)
[facility] Provider Note   CSN: [id number] Arrival date & time: 05/03/16  2119   By signing my name below, I, [name], attest that this documentation has been prepared under the direction and in the presence of Deno Etienne, DO. Electronically signed, [name], ED Scribe. 05/04/16. 1:06 AM.   History   Chief Complaint Chief Complaint  Patient presents with  . Emesis   The history is provided by the patient and medical records. No language interpreter was used.    HPI Comments: LETOSHA Strickland is a 66 y.o. female who presents to the Emergency Department complaining of off and on abdominal pain radiating to the back since 5:00 AM 05/02/2016. She describes her abdominal discomfort as burning with indigestion and acid reflux, and she states her pain is gradually worsening. She adds her pain is worse with eating. Pt states she awakened vomiting ~5 AM on the day of onset, noting multiple episodes of vomiting and dry heaves until 12:00AM 05/03/2016. She reports persistent nausea today without vomiting. Pt denies Hx of gallbladder removal.    Past Medical History:  Diagnosis Date  . Anxiety   . Barretts esophagus   . GERD (gastroesophageal reflux disease)   . Hiatal hernia   . Migraine headache with aura   . Mitral regurgitation 05-19-11    and Tricuspid Regurgitation(had Echo w/Bubble study)for migraines  . Rheumatoid arthritis ([place]) 12/2012    Patient Active Problem List   Diagnosis Date Noted  . GERD (gastroesophageal reflux disease) 12/05/2015  . PFO (patent foramen ovale) 07/29/2011  . BARRETTS ESOPHAGUS 07/06/2009  . UNSPECIFIED CONSTIPATION 06/04/2009  . HYPERLIPIDEMIA 05/31/2009  . ANXIETY 05/31/2009  . MIGRAINE, CHRONIC 05/31/2009  . ABDOMINAL BLOATING 05/31/2009  . PURE HYPERCHOLESTEROLEMIA 05/09/2009  . CHEST PAIN 05/09/2009    Past Surgical History:  Procedure Laterality Date  . CESAREAN SECTION  1976  . LAPAROSCOPIC OOPHORECTOMY  2005  .  VAGINAL HYSTERECTOMY  1995    OB History    No data available       Home Medications    Prior to Admission medications   Medication Sig Start Date End Date Taking? Authorizing Provider  Cholecalciferol (VITAMIN D) 2000 UNITS tablet Take 2,000 Units by mouth daily.    Historical Provider, MD  CO ENZYME Q-10 PO Take 11 tablets by mouth daily.    Historical Provider, MD  DULoxetine (CYMBALTA) 30 MG capsule Take 30 mg by mouth daily.    Historical Provider, MD  KRILL OIL PO Take 1 tablet by mouth daily.    Historical Provider, MD  ondansetron (ZOFRAN ODT) 4 MG disintegrating tablet 4mg  ODT q4 hours prn nausea/vomit 05/04/16   Deno Etienne, DO  pantoprazole (PROTONIX) 40 MG tablet TAKE 1 TABLET BY MOUTH TWICE A DAY 08/11/14   Lafayette Dragon, MD  pantoprazole (PROTONIX) 40 MG tablet TAKE 1 TABLET TWICE A DAY 02/01/16   Manus Gunning, MD  sucralfate (CARAFATE) 1 GM/10ML suspension Take 10 mLs (1 g total) by mouth as needed. 12/05/15   Manus Gunning, MD  temazepam (RESTORIL) 7.5 MG capsule Take 7.5 mg by mouth at bedtime as needed.    Historical Provider, MD    Family History Family History  Problem Relation Age of Onset  . Colon cancer Neg Hx   . Stomach cancer Neg Hx   . Esophageal cancer Neg Hx   . Rectal cancer Neg Hx     Social History Social History  Substance Use Topics  .  Smoking status: Former Smoker    Years: 13.00    Types: Cigarettes    Quit date: 03/31/1993  . Smokeless tobacco: Never Used  . Alcohol use 1.2 oz/week    2 Glasses of wine per week     Allergies   Codeine and Sulfonamide derivatives   Review of Systems Review of Systems  Constitutional: Negative for chills and fever.  HENT: Negative for congestion and rhinorrhea.   Eyes: Negative for redness and visual disturbance.  Respiratory: Negative for shortness of breath and wheezing.   Cardiovascular: Negative for chest pain and palpitations.  Gastrointestinal: Positive for abdominal pain,  nausea and vomiting.  Genitourinary: Negative for dysuria and urgency.  Musculoskeletal: Positive for back pain. Negative for arthralgias and myalgias.  Skin: Negative for pallor and wound.  Neurological: Negative for dizziness and headaches.     Physical Exam Updated Vital Signs BP (!) 162/102 (BP Location: Right Arm)   Pulse 82   Temp 97.5 F (36.4 C) (Oral)   Resp 22   Ht 4\' 11"  (1.499 m)   Wt 152 lb (68.9 kg)   SpO2 99%   BMI 30.70 kg/m   Physical Exam  Constitutional: She is oriented to person, place, and time. She appears well-developed and well-nourished. No distress.  HENT:  Head: Normocephalic and atraumatic.  Eyes: EOM are normal. Pupils are equal, round, and reactive to light.  Neck: Normal range of motion. Neck supple.  Cardiovascular: Normal rate and regular rhythm.  Exam reveals no gallop and no friction rub.   No murmur heard. Pulmonary/Chest: Effort normal. She has no wheezes. She has no rales.  Abdominal: Soft. She exhibits no distension. There is tenderness in the right upper quadrant. There is negative Murphy's sign.  Musculoskeletal: She exhibits no edema or tenderness.  Neurological: She is alert and oriented to person, place, and time.  Skin: Skin is warm and dry. She is not diaphoretic.  Psychiatric: She has a normal mood and affect. Her behavior is normal.  Nursing note and vitals reviewed.   ED Treatments / Results  DIAGNOSTIC STUDIES: Oxygen Saturation is 99% on RA, normal by my interpretation.    COORDINATION OF CARE: 12:24 AM Discussed treatment plan with pt at bedside and pt agreed to plan.  Labs (all labs ordered are listed, but only abnormal results are displayed) Labs Reviewed  COMPREHENSIVE METABOLIC PANEL - Abnormal; Notable for the following:       Result Value   Potassium 3.3 (*)    Glucose, Bld 100 (*)    BUN 21 (*)    Total Bilirubin 1.5 (*)    All other components within normal limits  URINALYSIS, ROUTINE W REFLEX  MICROSCOPIC - Abnormal; Notable for the following:    Specific Gravity, Urine 1.045 (*)    Ketones, ur 15 (*)    All other components within normal limits  LIPASE, BLOOD  CBC  TROPONIN I    EKG  EKG Interpretation  Date/Time:  Saturday May 03 2016 21:43:19 EST Ventricular Rate:  75 PR Interval:  162 QRS Duration: 70 QT Interval:  388 QTC Calculation: 433 R Axis:   -10 Text Interpretation:  Normal sinus rhythm Normal ECG No significant change since last tracing Confirmed by Winfred Leeds  MD, SAM [phone]) on 05/03/2016 9:50:31 PM       Radiology Dg Chest 2 View  Result Date: 05/04/2016 CLINICAL DATA:  Right lower rib pain.  Chest pain. EXAM: CHEST  2 VIEW COMPARISON:  Radiographs 03/20/2009 FINDINGS: Normal  heart size. Minimal aortic tortuosity. Mild apical pleuroparenchymal scarring. Pulmonary vasculature is normal. No consolidation, pleural effusion, or pneumothorax. No acute osseous abnormalities are seen. IMPRESSION: No acute abnormality. Electronically Signed   By: Jeb Levering M.D.   On: 05/04/2016 02:23   Ct Abdomen Pelvis W Contrast  Result Date: 05/04/2016 CLINICAL DATA:  Right upper quadrant abdominal pain. EXAM: CT ABDOMEN AND PELVIS WITH CONTRAST TECHNIQUE: Multidetector CT imaging of the abdomen and pelvis was performed using the standard protocol following bolus administration of intravenous contrast. CONTRAST:  18mL ISOVUE-300 IOPAMIDOL (ISOVUE-300) INJECTION 61% COMPARISON:  None. FINDINGS: Lower chest: Lingular subsegmental atelectasis or scarring. No consolidation or pleural fluid. Small hiatal hernia. Enteric contrast in the distal esophagus. Hepatobiliary: No focal liver abnormality is seen. No gallstones, gallbladder wall thickening, or biliary dilatation. Pancreas: No ductal dilatation or inflammation. Spleen: Normal in size without focal abnormality. Small splenule anteriorly. Adrenals/Urinary Tract: No adrenal nodule. Symmetric renal enhancement and excretion  on delayed phase imaging. No hydronephrosis or perinephric edema. Subcentimeter cortical hypodensity in the left kidney, too small to characterize but likely cyst. Urinary bladder is physiologically distended. Stomach/Bowel: Stomach is within normal limits. Appendix appears normal. No evidence of bowel wall thickening, distention, or inflammatory changes. Vascular/Lymphatic: Aortic atherosclerosis. No enlarged abdominal or pelvic lymph nodes. Reproductive: Status post hysterectomy. No adnexal masses. Other: No free air, free fluid, or intra-abdominal fluid collection. Tiny fat containing umbilical hernia. Musculoskeletal: There are no acute or suspicious osseous abnormalities. Hemi transitional lumbosacral anatomy. IMPRESSION: 1. Small hiatal hernia. Minimal enteric contrast in the distal esophagus suggests reflux. 2. No additional acute abnormality in the abdomen or pelvis. 3. Aortic atherosclerosis. Electronically Signed   By: Jeb Levering M.D.   On: 05/04/2016 02:21    Procedures Procedures (including critical care time)  Medications Ordered in ED Medications  sodium chloride 0.9 % bolus 1,000 mL (0 mLs Intravenous Stopped 05/04/16 0150)  ondansetron (ZOFRAN) injection 4 mg (4 mg Intravenous Given 05/04/16 0038)  gi cocktail (Maalox,Lidocaine,Donnatal) (30 mLs Oral Given 05/04/16 0038)  iopamidol (ISOVUE-300) 61 % injection 100 mL (100 mLs Intravenous Contrast Given 05/04/16 0202)  prochlorperazine (COMPAZINE) injection 10 mg (10 mg Intravenous Given 05/04/16 0322)  diphenhydrAMINE (BENADRYL) injection 25 mg (25 mg Intravenous Given 05/04/16 0321)     Initial Impression / Assessment and Plan / ED Course  I have reviewed the triage vital signs and the nursing notes.  Pertinent labs & imaging results that were available during my care of the patient were reviewed by me and considered in my medical decision making (see chart for details).     66 yo F With a chief complaint of epigastric pain that  radiates to her right shoulder. This been going on for the past day or so. She is unable to take by mouth at home. On my exam there is some concern for right upper quadrant tenderness. She had a negative Murphy sign. CT scan of the abdomen and pelvis is negative for acute pathology. Patient was reassessed and feels much better. Repeat exam is benign. Will have the patient follow-up for ultrasound in the morning as it is not currently available. PCP follow-up.   I personally performed the services described in this documentation, which was scribed in my presence. The recorded information has been reviewed and is accurate.   I have discussed the diagnosis/risks/treatment options with the patient and family and believe the pt to be eligible for discharge home to follow-up with PCP. We also discussed returning to  the ED immediately if new or worsening sx occur. We discussed the sx which are most concerning (e.g., sudden worsening pain, fever, inability to tolerate by mouth) that necessitate immediate return. Medications administered to the patient during their visit and any new prescriptions provided to the patient are listed below.  Medications given during this visit Medications  sodium chloride 0.9 % bolus 1,000 mL (0 mLs Intravenous Stopped 05/04/16 0150)  ondansetron (ZOFRAN) injection 4 mg (4 mg Intravenous Given 05/04/16 0038)  gi cocktail (Maalox,Lidocaine,Donnatal) (30 mLs Oral Given 05/04/16 0038)  iopamidol (ISOVUE-300) 61 % injection 100 mL (100 mLs Intravenous Contrast Given 05/04/16 0202)  prochlorperazine (COMPAZINE) injection 10 mg (10 mg Intravenous Given 05/04/16 0322)  diphenhydrAMINE (BENADRYL) injection 25 mg (25 mg Intravenous Given 05/04/16 0321)     The patient appears reasonably screen and/or stabilized for discharge and I doubt any other medical condition or other [facility] requiring further screening, evaluation, or treatment in the ED at this time prior to discharge.     Final Clinical  Impressions(s) / ED Diagnoses   Final diagnoses:  RUQ abdominal pain  Epigastric pain    New Prescriptions Discharge Medication List as of 05/04/2016  3:07 AM    START taking these medications   Details  ondansetron (ZOFRAN ODT) 4 MG disintegrating tablet 4mg  ODT q4 hours prn nausea/vomit, Print         Deno Etienne, DO 05/04/16 [id number]

## 2016-05-04 NOTE — ED Notes (Signed)
Pt alert, NAD, calm, interactive, resps e/u, sipping on PO contrast, tolerating well, (denies: nausea), "feel better", family at [facility].

## 2016-05-04 NOTE — ED Notes (Signed)
Pt returned from CT °

## 2016-05-05 ENCOUNTER — Ambulatory Visit (HOSPITAL_BASED_OUTPATIENT_CLINIC_OR_DEPARTMENT_OTHER)
Admission: RE | Admit: 2016-05-05 | Discharge: 2016-05-05 | Disposition: A | Discharge status: Home / Self Care | Payer: BLUE CROSS/BLUE SHIELD | Source: Ambulatory Visit | Attending: Emergency Medicine | Admitting: Emergency Medicine

## 2016-05-05 ENCOUNTER — Other Ambulatory Visit (HOSPITAL_BASED_OUTPATIENT_CLINIC_OR_DEPARTMENT_OTHER): Payer: Self-pay | Admitting: Emergency Medicine

## 2016-05-05 DIAGNOSIS — R1013 Epigastric pain: Secondary | ICD-10-CM | POA: Diagnosis not present

## 2016-05-05 DIAGNOSIS — R1011 Right upper quadrant pain: Secondary | ICD-10-CM | POA: Diagnosis not present

## 2016-05-14 ENCOUNTER — Encounter: Payer: PRIVATE HEALTH INSURANCE | Admitting: Gastroenterology

## 2016-06-17 ENCOUNTER — Other Ambulatory Visit (HOSPITAL_COMMUNITY): Payer: Self-pay | Admitting: Surgery

## 2016-06-17 DIAGNOSIS — K828 Other specified diseases of gallbladder: Secondary | ICD-10-CM

## 2016-06-20 ENCOUNTER — Ambulatory Visit (HOSPITAL_COMMUNITY)
Admission: RE | Admit: 2016-06-20 | Discharge: 2016-06-20 | Disposition: A | Discharge status: Home / Self Care | Payer: BLUE CROSS/BLUE SHIELD | Source: Ambulatory Visit | Attending: Surgery | Admitting: Surgery

## 2016-06-20 DIAGNOSIS — K828 Other specified diseases of gallbladder: Secondary | ICD-10-CM | POA: Diagnosis not present

## 2016-06-20 MED ORDER — TECHNETIUM TC 99M MEBROFENIN IV KIT
5.0000 | PACK | Freq: Once | INTRAVENOUS | Status: AC | PRN
  Administered 2016-06-20: 5 via INTRAVENOUS

## 2016-06-23 ENCOUNTER — Other Ambulatory Visit: Payer: Self-pay | Admitting: Gastroenterology

## 2016-06-26 ENCOUNTER — Encounter: Payer: Self-pay | Admitting: Gastroenterology

## 2016-06-26 ENCOUNTER — Ambulatory Visit (AMBULATORY_SURGERY_CENTER): Payer: BLUE CROSS/BLUE SHIELD | Admitting: *Deleted

## 2016-06-26 VITALS — Ht 59.5 in | Wt 158.2 lb

## 2016-06-26 DIAGNOSIS — K2270 Barrett's esophagus without dysplasia: Secondary | ICD-10-CM

## 2016-06-26 NOTE — Progress Notes (Signed)
Pt denies allergies to eggs or soy products. Denies difficulty with sedation or anesthesia. Denies any diet or weight loss medications. Denies use of supplemental oxygen.  Emmi instructions refused by patient. 

## 2016-06-30 ENCOUNTER — Encounter: Payer: Self-pay | Admitting: Gastroenterology

## 2016-07-08 ENCOUNTER — Ambulatory Visit (AMBULATORY_SURGERY_CENTER): Payer: BLUE CROSS/BLUE SHIELD | Admitting: Gastroenterology

## 2016-07-08 ENCOUNTER — Encounter: Payer: Self-pay | Admitting: Gastroenterology

## 2016-07-08 VITALS — BP 149/91 | HR 72 | Temp 98.7°F | Resp 20 | Ht 59.0 in | Wt 156.0 lb

## 2016-07-08 DIAGNOSIS — K219 Gastro-esophageal reflux disease without esophagitis: Secondary | ICD-10-CM | POA: Diagnosis not present

## 2016-07-08 DIAGNOSIS — K2270 Barrett's esophagus without dysplasia: Secondary | ICD-10-CM

## 2016-07-08 DIAGNOSIS — K228 Other specified diseases of esophagus: Secondary | ICD-10-CM | POA: Diagnosis not present

## 2016-07-08 MED ORDER — SODIUM CHLORIDE 0.9 % IV SOLN: 500.0000 mL | INTRAVENOUS | Status: DC

## 2016-07-08 MED ORDER — OMEPRAZOLE 40 MG PO CPDR: 40.0000 mg | DELAYED_RELEASE_CAPSULE | Freq: Two times a day (BID) | ORAL | 3 refills | Status: DC

## 2016-07-08 NOTE — Patient Instructions (Signed)
Impressions/recommendations:  Hiatal hernia (handout given)  YOU HAD AN ENDOSCOPIC PROCEDURE TODAY AT [facility]:   Refer to the procedure report that was given to you for any specific questions about what was found during the examination.  If the procedure report does not answer your questions, please call your gastroenterologist to clarify.  If you requested that your care partner not be given the details of your procedure findings, then the procedure report has been included in a sealed envelope for you to review at your convenience later.  YOU SHOULD EXPECT: Some feelings of bloating in the abdomen. Passage of more gas than usual.  Walking can help get rid of the air that was put into your GI tract during the procedure and reduce the bloating. If you had a lower endoscopy (such as a colonoscopy or flexible sigmoidoscopy) you may notice spotting of blood in your stool or on the toilet paper. If you underwent a bowel prep for your procedure, you may not have a normal bowel movement for a few days.  Please Note:  You might notice some irritation and congestion in your nose or some drainage.  This is from the oxygen used during your procedure.  There is no need for concern and it should clear up in a day or so.  SYMPTOMS TO REPORT IMMEDIATELY:   Following upper endoscopy (EGD)  Vomiting of blood or coffee ground material  New chest pain or pain under the shoulder blades  Painful or persistently difficult swallowing  New shortness of breath  Fever of 100F or higher  Black, tarry-looking stools  For urgent or emergent issues, a gastroenterologist can be reached at any hour by calling [phone].   DIET:  We do recommend a small meal at first, but then you may proceed to your regular diet.  Drink plenty of fluids but you should avoid alcoholic beverages for 24 hours.  ACTIVITY:  You should plan to take it easy for the rest of today and you should NOT DRIVE or use heavy  machinery until tomorrow (because of the sedation medicines used during the test).    FOLLOW UP: Our staff will call the number listed on your records the next business day following your procedure to check on you and address any questions or concerns that you may have regarding the information given to you following your procedure. If we do not reach you, we will leave a message.  However, if you are feeling well and you are not experiencing any problems, there is no need to return our call.  We will assume that you have returned to your regular daily activities without incident.  If any biopsies were taken you will be contacted by phone or by letter within the next 1-3 weeks.  Please call us at [phone] if you have not heard about the biopsies in 3 weeks.    SIGNATURES/CONFIDENTIALITY: You and/or your care partner have signed paperwork which will be entered into your electronic medical record.  These signatures attest to the fact that that the information above on your After Visit Summary has been reviewed and is understood.  Full responsibility of the confidentiality of this discharge information lies with you and/or your care-partner.

## 2016-07-08 NOTE — Progress Notes (Signed)
1033 A/ox3 pleased with MAC, report to [facility]

## 2016-07-08 NOTE — Progress Notes (Signed)
Dental advisory given to patient 

## 2016-07-08 NOTE — Op Note (Signed)
[facility] Patient Name: Cynthia Strickland Procedure Date: 07/08/2016 10:04 AM MRN: [id number] Endoscopist: Remo Lipps P. Haidyn Chadderdon MD, MD Age: 66 Referring MD:  Date of Birth: 1950-04-10 Gender: Female Account #: [id number] Procedure:                Upper GI endoscopy Indications:              Heartburn with breakthrough despite protonix 40mg                             twice daily, Follow-up of reported short segment                            Barrett's esophagus Medicines:                Monitored Anesthesia Care Procedure:                Pre-Anesthesia Assessment:                           - Prior to the procedure, a History and Physical                            was performed, and patient medications and                            allergies were reviewed. The patient's tolerance of                            previous anesthesia was also reviewed. The risks                            and benefits of the procedure and the sedation                            options and risks were discussed with the patient.                            All questions were answered, and informed consent                            was obtained. Prior Anticoagulants: The patient has                            taken no previous anticoagulant or antiplatelet                            agents. ASA Grade Assessment: II - A patient with                            mild systemic disease. After reviewing the risks                            and benefits, the patient was deemed in  satisfactory condition to undergo the procedure.                           After obtaining informed consent, the endoscope was                            passed under direct vision. Throughout the                            procedure, the patient's blood pressure, pulse, and                            oxygen saturations were monitored continuously. The                            Endoscope was introduced  through the mouth, and                            advanced to the second part of duodenum. The upper                            GI endoscopy was accomplished without difficulty.                            The patient tolerated the procedure well. Scope In: Scope Out: Findings:                 Esophagogastric landmarks were identified: the                            Z-line was found at 36 cm, the gastroesophageal                            junction was found at 36 cm and the upper extent of                            the gastric folds was found at 36 cm from the                            incisors.                           A 3 cm hiatal hernia was present.                           The Z-line was irregular with a small extension of                            a tongue of salmon colored mucosa, although < 1cm                            in length and without nodularity. Given the  patient's reported history of Barrett's esophagus,                            biopsies were taken with a cold forceps for                            histology.                           The exam of the esophagus was otherwise normal. No                            esophagitis.                           The entire examined stomach was normal.                           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 Biopsies were taken with a cold                            forceps for histology to ensure no adenomatous                            change.                           The exam of the duodenum was otherwise normal. Complications:            No immediate complications. Estimated blood loss:                            Minimal. Estimated Blood Loss:     Estimated blood loss was minimal. Impression:               - Esophagogastric landmarks identified.                            - 3 cm hiatal hernia.                           - Z-line irregular as outlined above. Biopsied                            given patient's reported history of Barrett's                            esophagus.                           - No esophagitis noted                           -  Normal stomach.                           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 Biopsied to                            ensure no adenomatous change. Recommendation:           - Patient has a contact number available for                            emergencies. The signs and symptoms of potential                            delayed complications were discussed with the                            patient. Return to normal activities tomorrow.                            Written discharge instructions were provided to the                            patient.                           - Resume previous diet.                           - Continue present medications.                           - Await pathology results.                           - Consider switch to different PPI (on long term                            protonix with breakthrough, has not tried                            alternatives). Would try omeprazole 40mg  twice                            daily. If symptoms persist despite trial of another                            PPI, consider 24 HR pH impedance testing for                            further evaluation. Patient has non-erosive reflux  disease, may have non-acid reflux Carlota Raspberry. Mirai Greenwood MD, MD 07/08/2016 10:35:35 AM This report has been signed electronically.

## 2016-07-08 NOTE — Progress Notes (Signed)
Called to room to assist during endoscopic procedure.  Patient ID and intended procedure confirmed with present staff. Received instructions for my participation in the procedure from the performing physician.  

## 2016-07-09 ENCOUNTER — Telehealth: Payer: Self-pay

## 2016-07-09 NOTE — Telephone Encounter (Signed)
Left message on answering machine. 

## 2016-07-09 NOTE — Telephone Encounter (Signed)
  Follow up Call-  Call back number 07/08/2016  Post procedure Call Back phone  # [phone]  Permission to leave phone message Yes  Some recent data might be hidden     Left 2nd message

## 2016-07-11 ENCOUNTER — Encounter: Payer: Self-pay | Admitting: Gastroenterology

## 2016-12-18 ENCOUNTER — Institutional Professional Consult (permissible substitution): Payer: BLUE CROSS/BLUE SHIELD | Admitting: Neurology

## 2017-06-26 ENCOUNTER — Emergency Department (HOSPITAL_BASED_OUTPATIENT_CLINIC_OR_DEPARTMENT_OTHER)
Admission: EM | Admit: 2017-06-26 | Discharge: 2017-06-26 | Disposition: A | Discharge status: Home / Self Care | Payer: BLUE CROSS/BLUE SHIELD | Attending: Emergency Medicine | Admitting: Emergency Medicine

## 2017-06-26 ENCOUNTER — Encounter (HOSPITAL_BASED_OUTPATIENT_CLINIC_OR_DEPARTMENT_OTHER): Payer: Self-pay | Admitting: *Deleted

## 2017-06-26 DIAGNOSIS — R197 Diarrhea, unspecified: Secondary | ICD-10-CM | POA: Diagnosis not present

## 2017-06-26 DIAGNOSIS — Z79899 Other long term (current) drug therapy: Secondary | ICD-10-CM | POA: Insufficient documentation

## 2017-06-26 DIAGNOSIS — Z8719 Personal history of other diseases of the digestive system: Secondary | ICD-10-CM | POA: Diagnosis not present

## 2017-06-26 DIAGNOSIS — Z87891 Personal history of nicotine dependence: Secondary | ICD-10-CM | POA: Diagnosis not present

## 2017-06-26 DIAGNOSIS — I10 Essential (primary) hypertension: Secondary | ICD-10-CM | POA: Insufficient documentation

## 2017-06-26 DIAGNOSIS — R112 Nausea with vomiting, unspecified: Secondary | ICD-10-CM | POA: Diagnosis not present

## 2017-06-26 LAB — COMPREHENSIVE METABOLIC PANEL
ALT: 23 U/L (ref 14–54)
ANION GAP: 8 (ref 5–15)
AST: 34 U/L (ref 15–41)
Albumin: 4 g/dL (ref 3.5–5.0)
Alkaline Phosphatase: 67 U/L (ref 38–126)
BUN: 13 mg/dL (ref 6–20)
CHLORIDE: 107 mmol/L (ref 101–111)
CO2: 21 mmol/L — AB (ref 22–32)
Calcium: 8.5 mg/dL — ABNORMAL LOW (ref 8.9–10.3)
Creatinine, Ser: 0.83 mg/dL (ref 0.44–1.00)
GFR calc Af Amer: >60 mL/min (ref >60)
GFR calc non Af Amer: >60 mL/min (ref >60)
GLUCOSE: 101 mg/dL — AB (ref 65–99)
POTASSIUM: 3.4 mmol/L — AB (ref 3.5–5.1)
SODIUM: 136 mmol/L (ref 135–145)
Total Bilirubin: 0.3 mg/dL (ref 0.3–1.2)
Total Protein: 6.5 g/dL (ref 6.5–8.1)

## 2017-06-26 LAB — CBC WITH DIFFERENTIAL/PLATELET
BASOS ABS: 0 10*3/uL (ref 0.0–0.1)
BASOS PCT: 0 %
EOS ABS: 0 10*3/uL (ref 0.0–0.7)
EOS PCT: 1 %
HCT: 38.7 % (ref 36.0–46.0)
Hemoglobin: 12.7 g/dL (ref 12.0–15.0)
Lymphocytes Relative: 29 %
Lymphs Abs: 1.3 10*3/uL (ref 0.7–4.0)
MCH: 29.4 pg (ref 26.0–34.0)
MCHC: 32.8 g/dL (ref 30.0–36.0)
MCV: 89.6 fL (ref 78.0–100.0)
MONO ABS: 0.7 10*3/uL (ref 0.1–1.0)
MONOS PCT: 16 %
Neutro Abs: 2.4 10*3/uL (ref 1.7–7.7)
Neutrophils Relative %: 54 %
PLATELETS: 226 10*3/uL (ref 150–400)
RBC: 4.32 MIL/uL (ref 3.87–5.11)
RDW: 13.9 % (ref 11.5–15.5)
WBC: 4.4 10*3/uL (ref 4.0–10.5)

## 2017-06-26 LAB — URINALYSIS, ROUTINE W REFLEX MICROSCOPIC
Bilirubin Urine: NEGATIVE
Glucose, UA: NEGATIVE mg/dL
Hgb urine dipstick: NEGATIVE
KETONES UR: NEGATIVE mg/dL
LEUKOCYTES UA: NEGATIVE
NITRITE: NEGATIVE
PH: 6 (ref 5.0–8.0)
Protein, ur: NEGATIVE mg/dL
Specific Gravity, Urine: <1.005 — ABNORMAL LOW (ref 1.005–1.030)

## 2017-06-26 LAB — LIPASE, BLOOD: Lipase: 31 U/L (ref 11–51)

## 2017-06-26 MED ORDER — ONDANSETRON HCL 4 MG PO TABS: 4.0000 mg | ORAL_TABLET | Freq: Three times a day (TID) | ORAL | 0 refills | Status: DC | PRN

## 2017-06-26 MED ORDER — ONDANSETRON HCL 4 MG/2ML IJ SOLN
4.0000 mg | Freq: Once | INTRAMUSCULAR | Status: AC
  Administered 2017-06-26: 4 mg via INTRAVENOUS
  Filled 2017-06-26: qty 2

## 2017-06-26 MED ORDER — SODIUM CHLORIDE 0.9 % IV BOLUS
1000.0000 mL | Freq: Once | INTRAVENOUS | Status: AC
  Administered 2017-06-26: 1000 mL via INTRAVENOUS

## 2017-06-26 MED ORDER — CIPROFLOXACIN HCL 500 MG PO TABS: 500.0000 mg | ORAL_TABLET | Freq: Two times a day (BID) | ORAL | 0 refills | Status: AC

## 2017-06-26 MED ORDER — GI COCKTAIL ~~LOC~~
30.0000 mL | Freq: Once | ORAL | Status: AC
  Administered 2017-06-26: 30 mL via ORAL
  Filled 2017-06-26: qty 30

## 2017-06-26 MED ORDER — IBUPROFEN 800 MG PO TABS
800.0000 mg | ORAL_TABLET | Freq: Once | ORAL | Status: AC
  Administered 2017-06-26: 800 mg via ORAL
  Filled 2017-06-26: qty 1

## 2017-06-26 NOTE — Discharge Instructions (Addendum)
You were given a prescription for antibiotics. Please take the antibiotic prescription fully.   I have prescribed a new medication for you today. It is important that when you pick the prescription up you discuss the potential interactions of this medication with other medications you are taking, including over the counter medications, with the pharmacists.   This new medication has potential side effects. Be sure to contact your primary care provider or return to the emergency department if you are experiencing new symptoms that you are unable to tolerate after starting the medication. You need to receive medical evaluation immediately if you start to experience blistering of the skin, rash, swelling, or difficulty breathing as these signs could indicate a more serious medication side effect.   Please follow up with your primary doctor within the next 5-7 days.  You will need to follow-up to have your blood pressure checked as well.  If you have any chest pain, shortness of breath, headaches, decreased urine output you will need to return immediately to the emergency department. Please also return to the ER sooner if you have any new or worsening symptoms, or if you have any of the following symptoms:  Abdominal pain that does not go away.  You have a fever.  You keep throwing up (vomiting).  The pain is felt only in portions of the abdomen. Pain in the right side could possibly be appendicitis. In an adult, pain in the left lower portion of the abdomen could be colitis or diverticulitis.  You pass bloody or black tarry stools.  There is bright red blood in the stool.  The constipation stays for more than 4 days.  There is belly (abdominal) or rectal pain.  You do not seem to be getting better.  You have any questions or concerns.

## 2017-06-26 NOTE — ED Notes (Signed)
Pt. [place] and [place].  Pt.reports vomiting since Wednesday and diarrhea started on today.  Pt. In no distress with feeling dehydrated after the not eating and vomiting and diarrhea.  Pt. Color and turgor of skin WNL.

## 2017-06-26 NOTE — ED Provider Notes (Signed)
[facility] EMERGENCY DEPARTMENT Provider Note   CSN: [id number] Arrival date & time: 06/26/17  1412     History   Chief Complaint Chief Complaint  Patient presents with  . Abdominal Pain  . Emesis    HPI Cynthia Strickland is a 67 y.o. female.  HPI   Pt is a 67 y/o female with a h/o HLD, GERD, barretts esophagus, RA who presents to the ED today c/o mild abd pain that began gradually about 4-5 days ago while she was in [place]. States about 3 days ago she was on flight home and her abd pain began to worsen and she began vomiting on plane. Reports 6-7 episodes of vomiting on plane. Was taking zofran at home for the last 2 days and has had no more episode of vomiting. However now has cramping epigastric abd pain, lower abd pain, and diarrhea. No blood in stool or vomit. Reports myaglias, chills and sweats. Temp today 100.35F and took Tylenol at 1:00pm. She reports headache for last 2 days which resolved. No vision changes. No lightheadedness or dizziness. No weakness or numbness. No chest pain or shortness of breath.   Pt was in [place] for 10 days. States she did not drink the water in [place].  Has not eaten anything since about 4 days ago.    Past Medical History:  Diagnosis Date  . Anxiety   . Barretts esophagus   . Cataract   . GERD (gastroesophageal reflux disease)   . Hiatal hernia   . Hyperlipidemia   . Hypertension    white coat syndrome  . Migraine headache with aura   . Mitral regurgitation 05-19-11    and Tricuspid Regurgitation(had Echo w/Bubble study)for migraines  . Rheumatoid arthritis ([place]) 12/2012    Patient Active Problem List   Diagnosis Date Noted  . GERD (gastroesophageal reflux disease) 12/05/2015  . PFO (patent foramen ovale) 07/29/2011  . BARRETTS ESOPHAGUS 07/06/2009  . UNSPECIFIED CONSTIPATION 06/04/2009  . HYPERLIPIDEMIA 05/31/2009  . ANXIETY 05/31/2009  . MIGRAINE, CHRONIC 05/31/2009  . ABDOMINAL BLOATING 05/31/2009  . PURE  HYPERCHOLESTEROLEMIA 05/09/2009  . CHEST PAIN 05/09/2009    Past Surgical History:  Procedure Laterality Date  . CESAREAN SECTION  1976  . COLONOSCOPY    . LAPAROSCOPIC OOPHORECTOMY  2005  . UPPER GASTROINTESTINAL ENDOSCOPY    . VAGINAL HYSTERECTOMY  1995     OB History   None      Home Medications    Prior to Admission medications   Medication Sig Start Date End Date Taking? Authorizing Provider  buPROPion (WELLBUTRIN XL) 150 MG 24 hr tablet Take 150 mg by mouth daily.   Yes [provider]  Cholecalciferol (VITAMIN D) 2000 UNITS tablet Take 2,000 Units by mouth daily.   Yes [provider]  cyclobenzaprine (FLEXERIL) 5 MG tablet Take 5 mg by mouth. As needed 05/27/16  Yes [provider]  DULoxetine (CYMBALTA) 30 MG capsule Take 30 mg by mouth every morning. 07/04/16  Yes [provider]  omeprazole (PRILOSEC) 40 MG capsule Take 1 capsule (40 mg total) by mouth 2 (two) times daily. 07/08/16  Yes Armbruster, Carlota Raspberry, MD  pantoprazole (PROTONIX) 40 MG tablet TAKE 1 TABLET BY MOUTH TWICE A DAY 08/11/14  Yes Lafayette Dragon, MD  simvastatin (ZOCOR) 20 MG tablet Take 20 mg by mouth. On at bedtime 06/27/16  Yes [provider]  temazepam (RESTORIL) 7.5 MG capsule Take 7.5 mg by mouth at bedtime as needed.  Yes [provider]  ciprofloxacin (CIPRO) 500 MG tablet Take 1 tablet (500 mg total) by mouth every 12 (twelve) hours for 5 days. 06/26/17 07/01/17  Linton Stolp S, PA-C  eletriptan (RELPAX) 40 MG tablet Take 40 mg by mouth.    [provider]  ondansetron (ZOFRAN ODT) 4 MG disintegrating tablet 4mg  ODT q4 hours prn nausea/vomit Patient not taking: Reported on 06/26/2016 05/04/16   Deno Etienne, DO  ondansetron (ZOFRAN) 4 MG tablet Take 1 tablet (4 mg total) by mouth every 8 (eight) hours as needed for nausea or vomiting. 06/26/17   Tequilla Cousineau S, PA-C  ranitidine (ZANTAC) 150 MG tablet Take 150 mg by mouth. As needed 01/21/16    [provider]  sucralfate (CARAFATE) 1 GM/10ML suspension Take 10 mLs (1 g total) by mouth as needed. Patient not taking: Reported on 06/26/2016 12/05/15   Yetta Flock, MD  SUMAtriptan (IMITREX) 100 MG tablet Take 100 mg by mouth. As needed 10/06/15   [provider]    Family History Family History  Problem Relation Age of Onset  . Endometrial cancer Mother   . Heart disease Father   . Heart disease Maternal Grandmother   . Stroke Maternal Grandmother   . Pancreatic cancer Maternal Grandfather   . Heart disease Paternal Grandfather   . Stroke Paternal Grandfather   . Colon cancer Neg Hx     Social History Social History   Tobacco Use  . Smoking status: Former Smoker    Years: 13.00    Types: Cigarettes    Last attempt to quit: 03/31/1993    Years since quitting: 24.2  . Smokeless tobacco: Never Used  Substance Use Topics  . Alcohol use: Yes    Alcohol/week: 1.2 oz    Types: 2 Glasses of wine per week  . Drug use: No     Allergies   Codeine and Sulfonamide derivatives   Review of Systems Review of Systems  Constitutional: Positive for chills. Negative for fever.  HENT: Negative for congestion, rhinorrhea and sore throat.   Eyes: Negative for pain and visual disturbance.  Respiratory: Negative for cough and shortness of breath.   Cardiovascular: Negative for chest pain.  Gastrointestinal: Positive for abdominal pain, diarrhea, nausea and vomiting. Negative for blood in stool and constipation.  Genitourinary: Negative for dysuria, flank pain, hematuria and urgency.  Musculoskeletal: Negative for arthralgias and back pain.  Skin: Negative for rash.  Neurological: Negative for headaches.  All other systems reviewed and are negative.    Physical Exam Updated Vital Signs BP (!) 159/95   Pulse 62   Temp 98.7 F (37.1 C) (Oral)   Resp 18   Ht 4' 11.5" (1.511 m)   Wt 68 kg (150 lb)   SpO2 99%   BMI 29.79 kg/m   Physical Exam    Constitutional: She appears well-developed and well-nourished.  Non-toxic appearance. She does not appear ill. No distress.  HENT:  Head: Normocephalic and atraumatic.  Mouth/Throat: Oropharynx is clear and moist.  Eyes: Pupils are equal, round, and reactive to light. Conjunctivae and EOM are normal.  Neck: Neck supple.  Cardiovascular: Normal rate, regular rhythm and normal heart sounds.  No murmur heard. Pulmonary/Chest: Effort normal and breath sounds normal. No respiratory distress.  Abdominal: Soft. Bowel sounds are normal. There is no rigidity, no rebound, no guarding and no CVA tenderness.  Mild generalized abd ttp without guarding, rebound ttp, or rigidity  Musculoskeletal: She exhibits no edema.  Neurological: She  is alert.  Skin: Skin is warm and dry.  Psychiatric: She has a normal mood and affect.  Nursing note and vitals reviewed.    ED Treatments / Results  Labs (all labs ordered are listed, but only abnormal results are displayed) Labs Reviewed  COMPREHENSIVE METABOLIC PANEL - Abnormal; Notable for the following components:      Result Value   Potassium 3.4 (*)    CO2 21 (*)    Glucose, Bld 101 (*)    Calcium 8.5 (*)    All other components within normal limits  URINALYSIS, ROUTINE W REFLEX MICROSCOPIC - Abnormal; Notable for the following components:   Specific Gravity, Urine <1.005 (*)    All other components within normal limits  CBC WITH DIFFERENTIAL/PLATELET  LIPASE, BLOOD    EKG None  Radiology No results found.  Procedures Procedures (including critical care time)  Medications Ordered in ED Medications  sodium chloride 0.9 % bolus 1,000 mL (0 mLs Intravenous Stopped 06/26/17 1801)  ondansetron (ZOFRAN) injection 4 mg (4 mg Intravenous Given 06/26/17 1655)  gi cocktail (Maalox,Lidocaine,Donnatal) (30 mLs Oral Given 06/26/17 1656)  ibuprofen (ADVIL,MOTRIN) tablet 800 mg (800 mg Oral Given 06/26/17 1656)     Initial Impression / Assessment and  Plan / ED Course  I have reviewed the triage vital signs and the nursing notes.  Pertinent labs & imaging results that were available during my care of the patient were reviewed by me and considered in my medical decision making (see chart for details).       Final Clinical Impressions(s) / ED Diagnoses   Final diagnoses:  Nausea vomiting and diarrhea  Hypertension, unspecified type   Patient with symptoms consistent with gastroenteritis. Recent travel to [place]. Possibly bacterial and will give cipro. Fluid bolus, nausea meds, ibuprofen and gi cocktail given and sxs improved. Vitals are overal stable, no fever.  No signs of dehydration, no vomiting in ED.  Lungs are clear.  No focal abdominal pain, no concern for appendicitis, cholecystitis, pancreatitis, ruptured viscus, UTI, kidney stone, or any other abdominal etiology. Mildly htn, but no EOD observed or suspected. Do no suspect htn emergency/urgency. Pt states she has whitecoat HTN, advised her to f/u with pcp about bp recheck.  Supportive therapy indicated with return if symptoms worsen.  Patient counseled.   ED Discharge Orders        Ordered    ciprofloxacin (CIPRO) 500 MG tablet  Every 12 hours     06/26/17 1928    ondansetron (ZOFRAN) 4 MG tablet  Every 8 hours PRN     06/26/17 1928       Rodney Booze, PA-C 06/27/17 0032    Hayden Rasmussen, MD 06/27/17 [phone]

## 2017-06-26 NOTE — ED Triage Notes (Signed)
Pt went to [place]. Began to have severe abdominal pain, and began throwing up on the plane on Wednesday. Pt has been sick every since, not eating and feeling awful. Pt is aching all over.

## 2017-07-05 ENCOUNTER — Other Ambulatory Visit: Payer: Self-pay | Admitting: Gastroenterology

## 2017-07-05 DIAGNOSIS — K2270 Barrett's esophagus without dysplasia: Secondary | ICD-10-CM

## 2018-07-23 ENCOUNTER — Other Ambulatory Visit: Payer: Self-pay | Admitting: Gastroenterology

## 2018-07-23 DIAGNOSIS — K2270 Barrett's esophagus without dysplasia: Secondary | ICD-10-CM

## 2018-08-26 ENCOUNTER — Telehealth: Payer: Self-pay | Admitting: *Deleted

## 2018-08-26 ENCOUNTER — Encounter: Payer: Self-pay | Admitting: *Deleted

## 2018-08-26 NOTE — Addendum Note (Signed)
Addended by: Hope Pigeon on: 08/26/2018 04:25 PM   Modules accepted: Orders

## 2018-08-26 NOTE — Telephone Encounter (Signed)
Called, LVM for pt to call office for me to update her med list, pharmacy, allergies, history prior to visit with Dr. Felecia Shelling on 08/30/18

## 2018-08-26 NOTE — Telephone Encounter (Signed)
Patient called office back. I updated med list, pharmacy, allergies, history on file for in office visit on 08/30/18. Explained new check in process.

## 2018-08-30 ENCOUNTER — Other Ambulatory Visit: Payer: Self-pay

## 2018-08-30 ENCOUNTER — Ambulatory Visit (INDEPENDENT_AMBULATORY_CARE_PROVIDER_SITE_OTHER): Payer: BLUE CROSS/BLUE SHIELD | Admitting: Neurology

## 2018-08-30 ENCOUNTER — Encounter: Payer: Self-pay | Admitting: Neurology

## 2018-08-30 VITALS — BP 118/116 | HR 85 | Temp 98.2°F | Ht 59.5 in | Wt 169.0 lb

## 2018-08-30 DIAGNOSIS — R635 Abnormal weight gain: Secondary | ICD-10-CM | POA: Diagnosis not present

## 2018-08-30 DIAGNOSIS — M542 Cervicalgia: Secondary | ICD-10-CM | POA: Diagnosis not present

## 2018-08-30 DIAGNOSIS — G4489 Other headache syndrome: Secondary | ICD-10-CM | POA: Diagnosis not present

## 2018-08-30 DIAGNOSIS — Q2112 Patent foramen ovale: Secondary | ICD-10-CM

## 2018-08-30 DIAGNOSIS — M069 Rheumatoid arthritis, unspecified: Secondary | ICD-10-CM | POA: Diagnosis not present

## 2018-08-30 DIAGNOSIS — H5711 Ocular pain, right eye: Secondary | ICD-10-CM

## 2018-08-30 DIAGNOSIS — Q211 Atrial septal defect: Secondary | ICD-10-CM

## 2018-08-30 MED ORDER — MELOXICAM 7.5 MG PO TABS: 7.5000 mg | ORAL_TABLET | Freq: Every day | ORAL | 5 refills | Status: DC

## 2018-08-30 MED ORDER — PROMETHAZINE HCL 12.5 MG PO TABS: 12.5000 mg | ORAL_TABLET | Freq: Four times a day (QID) | ORAL | 1 refills | Status: DC | PRN

## 2018-08-30 MED ORDER — ELETRIPTAN HYDROBROMIDE 40 MG PO TABS: 40.0000 mg | ORAL_TABLET | ORAL | 5 refills | Status: DC | PRN

## 2018-08-30 MED ORDER — PREDNISONE 10 MG (21) PO TBPK: ORAL_TABLET | ORAL | 0 refills | Status: DC

## 2018-08-30 NOTE — Progress Notes (Signed)
[facility]  PATIENT: NEMESIS RAINWATER DOB: 1950/10/06  REFERRING DOCTOR OR PCP:  Dr. [name] ([place]) and Dr. Rozetta Nunnery (PCP) SOURCE: Patient, notes from Dr. [name].   _________________________________   HISTORICAL  CHIEF COMPLAINT:  Chief Complaint  Patient presents with  . New Patient (Initial Visit)    RM 12, alone. paper referral for ocular migraines.     HISTORY OF PRESENT ILLNESS:  I had the pleasure of seeing patient, Wyolene Weimann, at [facility] for neurologic consultation regarding her possible ocular migraines.  About 7 years ago I saw her at [facility] for classic migraine headaches.   As they were not frequent, she was just placed on Relpax.   A brain MRI was done.   She had some nonspecific foci on brain MRI and we did a bubble study ECHO that showed a PFO.   These typical migraines for her usually have a visual aura followed by pounding pain 15 minutes later.  She gets nausea and moving worsens the pain.  She usually has some photophobia and phonophobia.  Typical migraines continued to occur rarely.    A month ago, she began to note short bursts of sharp pain in the right eye followed by a duller pain in the temple and forehead and occiput, all on the right.   These seem different than her typical migraine headaches.  Specifically, she does not have any photophobia or phonophobia and there is much milder nausea.   Pain is the same in the morning and night when present.    NSAID's help a little bit.   She saw Dr. [name] (Ophthalmology) who referred her to the office.   VA was 20/25 either eye.  Tonometry was 18 / 18.   PERRLA, VF full.  There is Mild OS conjunctivochalasis, mild cataracts.  Normal discs.      She was placed on an ointment for the mild blepharitis, advised to use artificial tears and referred for further evaluation.     She has OSA and is using CPAP.    REVIEW OF SYSTEMS: Constitutional: No fevers, chills,  sweats, or change in appetite.  She has some insomnia. Eyes: No visual changes, double vision.  Eye pain as above. Ear, nose and throat: No hearing loss, ear pain, nasal congestion, sore throat Cardiovascular: No chest pain, palpitations Respiratory: No shortness of breath at rest or with exertion.   No wheezes GastrointestinaI: No nausea, vomiting, diarrhea, abdominal pain, fecal incontinence Genitourinary: No dysuria, urinary retention or frequency.  No nocturia. Musculoskeletal: No neck pain, back pain Integumentary: No rash, pruritus, skin lesions Neurological: as above Psychiatric: She has had depression and anxiety, better on medications. Endocrine: No palpitations, diaphoresis, change in appetite, change in weigh or increased thirst Hematologic/Lymphatic: No anemia, purpura, petechiae. Allergic/Immunologic: No itchy/runny eyes, nasal congestion, recent allergic reactions, rashes  ALLERGIES: Allergies  Allergen Reactions  . Codeine Nausea Only  . Sulfonamide Derivatives Rash    HOME MEDICATIONS:  Current Outpatient Medications:  .  cyclobenzaprine (FLEXERIL) 5 MG tablet, Take 5 mg by mouth. As needed, Disp: , Rfl:  .  DULoxetine (CYMBALTA) 30 MG capsule, Take 30 mg by mouth every morning., Disp: , Rfl:  .  omeprazole (PRILOSEC) 40 MG capsule, TAKE ONE CAPSULE BY MOUTH TWICE A DAY (Patient taking differently: Take 40 mg by mouth daily. ), Disp: 60 capsule, Rfl: 3 .  simvastatin (ZOCOR) 20 MG tablet, Take 20 mg by mouth. On at bedtime, Disp: , Rfl:  .  sucralfate (CARAFATE) 1 GM/10ML suspension, Take 10 mLs (1 g total) by mouth as needed., Disp: 420 mL, Rfl: 1 .  temazepam (RESTORIL) 7.5 MG capsule, Take 7.5 mg by mouth at bedtime as needed., Disp: , Rfl:   Current Facility-Administered Medications:  .  0.9 %  sodium chloride infusion, 500 mL, Intravenous, Continuous, Armbruster, Carlota Raspberry, MD  PAST MEDICAL HISTORY: Past Medical History:  Diagnosis Date  . Anxiety   .  Barretts esophagus   . Cataract   . GERD (gastroesophageal reflux disease)   . Hiatal hernia   . Hyperlipidemia   . Hypertension    white coat syndrome  . Migraine headache with aura   . Mitral regurgitation 05-19-11    and Tricuspid Regurgitation(had Echo w/Bubble study)for migraines  . Rheumatoid arthritis ([place]) 12/2012    PAST SURGICAL HISTORY: Past Surgical History:  Procedure Laterality Date  . CESAREAN SECTION  1976  . COLONOSCOPY    . LAPAROSCOPIC OOPHORECTOMY  2005  . UPPER GASTROINTESTINAL ENDOSCOPY    . VAGINAL HYSTERECTOMY  1995    FAMILY HISTORY: Family History  Problem Relation Age of Onset  . Endometrial cancer Mother   . Heart disease Father   . Heart disease Maternal Grandmother   . Stroke Maternal Grandmother   . Pancreatic cancer Maternal Grandfather   . Heart disease Paternal Grandfather   . Stroke Paternal Grandfather   . Colon cancer Neg Hx     SOCIAL HISTORY:  Social History   Socioeconomic History  . Marital status: Married    Spouse name: Not on file  . Number of children: Not on file  . Years of education: Not on file  . Highest education level: Not on file  Occupational History  . Occupation: retired - worked in Public relations account executive  Social Needs  . Financial resource strain: Not on file  . Food insecurity:    Worry: Not on file    Inability: Not on file  . Transportation needs:    Medical: Not on file    Non-medical: Not on file  Tobacco Use  . Smoking status: Former Smoker    Years: 13.00    Types: Cigarettes    Last attempt to quit: 03/31/1993    Years since quitting: 25.4  . Smokeless tobacco: Never Used  Substance and Sexual Activity  . Alcohol use: Yes    Alcohol/week: 2.0 standard drinks    Types: 2 Glasses of wine per week    Comment: Wine sometimes-weekends  . Drug use: No  . Sexual activity: Not on file  Lifestyle  . Physical activity:    Days per week: Not on file    Minutes per session: Not on file  . Stress:  Not on file  Relationships  . Social connections:    Talks on phone: Not on file    Gets together: Not on file    Attends religious service: Not on file    Active member of club or organization: Not on file    Attends meetings of clubs or organizations: Not on file    Relationship status: Not on file  . Intimate partner violence:    Fear of current or ex partner: Not on file    Emotionally abused: Not on file    Physically abused: Not on file    Forced sexual activity: Not on file  Other Topics Concern  . Not on file  Social History Narrative   Right handed    Caffeine use: coffee very  morning   No soda     PHYSICAL EXAM  Vitals:   08/30/18 1325  BP: (!) 118/116  Pulse: 85  Weight: 169 lb (76.7 kg)  Height: 4' 11.5" (1.511 m)    Body mass index is 33.56 kg/m.   General: The patient is well-developed and well-nourished and in no acute distress  Eyes:  Funduscopic exam shows normal optic discs and retinal vessels.  Neck: The neck is supple, no carotid bruits are noted.  The neck is mildly tender at the occiput.  Cardiovascular: The heart has a regular rate and rhythm with a normal S1 and S2. There were no murmurs, gallops or rubs. Lungs are clear to auscultation.  Skin: Extremities are without rash or edema.   Neurologic Exam  Mental status: The patient is alert and oriented x 3 at the time of the examination. The patient has apparent normal recent and remote memory, with an apparently normal attention span and concentration ability.   Speech is normal.  Cranial nerves: Extraocular movements are full. Pupils are equal, round, and reactive to light and accomodation.  Visual fields are full.  Facial symmetry is present. There is good facial sensation to soft touch bilaterally.Facial strength is normal.  Trapezius and sternocleidomastoid strength is normal. No dysarthria is noted.  The tongue is midline, and the patient has symmetric elevation of the soft palate. No  obvious hearing deficits are noted.  Motor:  Muscle bulk is normal.   Tone is normal. Strength is  5 / 5 in all 4 extremities.   Sensory: Sensory testing is intact to pinprick, soft touch and vibration sensation in all 4 extremities.  Coordination: Cerebellar testing reveals good finger-nose-finger and heel-to-shin bilaterally.  Gait and station: Station is normal.   Gait is normal. Tandem gait is normal. Romberg is negative.   Reflexes: Deep tendon reflexes are symmetric and normal bilaterally.   Plantar responses are flexor.    DIAGNOSTIC DATA (LABS, IMAGING, TESTING) - I reviewed patient records, labs, notes, testing and imaging myself where available.  Lab Results  Component Value Date   WBC 4.4 06/26/2017   HGB 12.7 06/26/2017   HCT 38.7 06/26/2017   MCV 89.6 06/26/2017   PLT 226 06/26/2017      Component Value Date/Time   NA 136 06/26/2017 1650   K 3.4 (L) 06/26/2017 1650   CL 107 06/26/2017 1650   CO2 21 (L) 06/26/2017 1650   GLUCOSE 101 (H) 06/26/2017 1650   BUN 13 06/26/2017 1650   CREATININE 0.83 06/26/2017 1650   CALCIUM 8.5 (L) 06/26/2017 1650   PROT 6.5 06/26/2017 1650   ALBUMIN 4.0 06/26/2017 1650   AST 34 06/26/2017 1650   ALT 23 06/26/2017 1650   ALKPHOS 67 06/26/2017 1650   BILITOT 0.3 06/26/2017 1650   GFRNONAA >60 06/26/2017 1650   GFRAA >60 06/26/2017 1650   Lab Results  Component Value Date   CHOL 147 09/25/2011   HDL 40.70 09/25/2011   LDLCALC 89 09/25/2011   TRIG 87.0 09/25/2011   CHOLHDL 4 09/25/2011       ASSESSMENT AND PLAN  Eye pain, right  Other headache syndrome  Weight gain - Plan: Amb Ref to Medical Weight Management  Rheumatoid arthritis, involving unspecified site, unspecified rheumatoid factor presence (HCC)  PFO (patent foramen ovale)   In summary, Shaketa Serafin is a 68 year old woman with right-sided headaches and right eye pain over the last month.  These headaches are different than her classic migraines that  she  has had for many years.  She is tender over the occiput and occipital neuralgia may be contributing to the pain.  Some patients will feel this type of pain in the eye and adjacent forehead.      I did a trigger point injection of the right splenius capitus and C3-C4 paraspinal muscles on the right with 80 mg DepoMedrol in [place].   She, she will take meloxicam.  If not better, consider Topamax or gabapentin.  I also prescribe Relpax and Phenergan as she continues to have occasional classic migraine headaches.     Also in the past, she had an abnormal brain MRI and was found to have a patent foramen ovale and these can be associated with classic migraines.  I will try to get the old MRI images to review personally.  If the pain persists, consider a repeat MRI to determine if there has been significant change over time.  She will return to see me in 2 months or sooner for new or worsening neurologic symptoms  Thank you for asking me to see Ms. Hardin Negus.  Please let me know if I can be of further assistance with her or other patients in the future.  Irelynn Schermerhorn A. Felecia Shelling, MD, [facility] 06/01/3830, 9:19 PM Certified in Neurology, Clinical Neurophysiology, Sleep Medicine, Pain Medicine and Neuroimaging  [facility] [address] [place], [place] [address] [phone]

## 2018-09-02 ENCOUNTER — Telehealth: Payer: Self-pay | Admitting: Neurology

## 2018-09-02 ENCOUNTER — Other Ambulatory Visit: Payer: Self-pay | Admitting: Neurology

## 2018-09-02 MED ORDER — PHENTERMINE HCL 30 MG PO CAPS: 30.0000 mg | ORAL_CAPSULE | ORAL | 2 refills | Status: DC

## 2018-09-02 NOTE — Telephone Encounter (Signed)
Please let her know I called in a medication, phentermine, that may help her lose some weight.  It is 1 pill in the morning  We will let her know the results of the MRI after it is done.

## 2018-09-02 NOTE — Telephone Encounter (Signed)
Dr. Sater- please advise 

## 2018-09-02 NOTE — Telephone Encounter (Signed)
Pt has called to 2 tell Dr Felecia Shelling the medication he prescribed has been helping. Pt other reason for calling is so Dr Felecia Shelling can be aware that the weight loss clinic is not seeing pt age 66 and older until mid Sept.  Pt wants to know if Dr Felecia Shelling wants her to wait or if he was to refer her somewhere else.  Please call

## 2018-09-02 NOTE — Telephone Encounter (Signed)
Called pt. Relayed Dr. Garth Bigness message. Pt verbalized understanding and appreciation.

## 2018-11-17 ENCOUNTER — Ambulatory Visit: Payer: BLUE CROSS/BLUE SHIELD | Admitting: Neurology

## 2018-12-05 ENCOUNTER — Emergency Department (HOSPITAL_COMMUNITY): Payer: Medicare Other

## 2018-12-05 ENCOUNTER — Emergency Department (HOSPITAL_BASED_OUTPATIENT_CLINIC_OR_DEPARTMENT_OTHER): Payer: Medicare Other

## 2018-12-05 ENCOUNTER — Other Ambulatory Visit: Payer: Self-pay

## 2018-12-05 ENCOUNTER — Emergency Department (HOSPITAL_BASED_OUTPATIENT_CLINIC_OR_DEPARTMENT_OTHER)
Admission: EM | Admit: 2018-12-05 | Discharge: 2018-12-06 | Disposition: A | Discharge status: Home / Self Care | Payer: Medicare Other | Attending: Emergency Medicine | Admitting: Emergency Medicine

## 2018-12-05 DIAGNOSIS — Z87891 Personal history of nicotine dependence: Secondary | ICD-10-CM | POA: Diagnosis not present

## 2018-12-05 DIAGNOSIS — Z882 Allergy status to sulfonamides status: Secondary | ICD-10-CM | POA: Diagnosis not present

## 2018-12-05 DIAGNOSIS — Z79899 Other long term (current) drug therapy: Secondary | ICD-10-CM | POA: Diagnosis not present

## 2018-12-05 DIAGNOSIS — E785 Hyperlipidemia, unspecified: Secondary | ICD-10-CM | POA: Diagnosis not present

## 2018-12-05 DIAGNOSIS — I10 Essential (primary) hypertension: Secondary | ICD-10-CM | POA: Diagnosis not present

## 2018-12-05 DIAGNOSIS — Z20828 Contact with and (suspected) exposure to other viral communicable diseases: Secondary | ICD-10-CM | POA: Diagnosis not present

## 2018-12-05 DIAGNOSIS — I6782 Cerebral ischemia: Secondary | ICD-10-CM | POA: Diagnosis not present

## 2018-12-05 DIAGNOSIS — R4701 Aphasia: Secondary | ICD-10-CM | POA: Insufficient documentation

## 2018-12-05 DIAGNOSIS — R51 Headache: Secondary | ICD-10-CM | POA: Diagnosis present

## 2018-12-05 DIAGNOSIS — G43109 Migraine with aura, not intractable, without status migrainosus: Secondary | ICD-10-CM | POA: Diagnosis not present

## 2018-12-05 DIAGNOSIS — M069 Rheumatoid arthritis, unspecified: Secondary | ICD-10-CM | POA: Diagnosis not present

## 2018-12-05 DIAGNOSIS — Z885 Allergy status to narcotic agent status: Secondary | ICD-10-CM | POA: Insufficient documentation

## 2018-12-05 LAB — PROTIME-INR
INR: 1 (ref 0.8–1.2)
Prothrombin Time: 13.5 seconds (ref 11.4–15.2)

## 2018-12-05 LAB — CBC
HCT: 42.1 % (ref 36.0–46.0)
Hemoglobin: 13.7 g/dL (ref 12.0–15.0)
MCH: 29.3 pg (ref 26.0–34.0)
MCHC: 32.5 g/dL (ref 30.0–36.0)
MCV: 90 fL (ref 80.0–100.0)
Platelets: 245 10*3/uL (ref 150–400)
RBC: 4.68 MIL/uL (ref 3.87–5.11)
RDW: 13.6 % (ref 11.5–15.5)
WBC: 7.3 10*3/uL (ref 4.0–10.5)
nRBC: 0 % (ref 0.0–0.2)

## 2018-12-05 LAB — COMPREHENSIVE METABOLIC PANEL
ALT: 25 U/L (ref 0–44)
AST: 28 U/L (ref 15–41)
Albumin: 4.6 g/dL (ref 3.5–5.0)
Alkaline Phosphatase: 59 U/L (ref 38–126)
Anion gap: 13 (ref 5–15)
BUN: 16 mg/dL (ref 8–23)
CO2: 23 mmol/L (ref 22–32)
Calcium: 9.2 mg/dL (ref 8.9–10.3)
Chloride: 104 mmol/L (ref 98–111)
Creatinine, Ser: 0.78 mg/dL (ref 0.44–1.00)
GFR calc Af Amer: >60 mL/min (ref >60)
GFR calc non Af Amer: >60 mL/min (ref >60)
Glucose, Bld: 100 mg/dL — ABNORMAL HIGH (ref 70–99)
Potassium: 3.4 mmol/L — ABNORMAL LOW (ref 3.5–5.1)
Sodium: 140 mmol/L (ref 135–145)
Total Bilirubin: 1.1 mg/dL (ref 0.3–1.2)
Total Protein: 7.1 g/dL (ref 6.5–8.1)

## 2018-12-05 LAB — DIFFERENTIAL
Abs Immature Granulocytes: 0.02 10*3/uL (ref 0.00–0.07)
Basophils Absolute: 0 10*3/uL (ref 0.0–0.1)
Basophils Relative: 0 %
Eosinophils Absolute: 0.1 10*3/uL (ref 0.0–0.5)
Eosinophils Relative: 1 %
Immature Granulocytes: 0 %
Lymphocytes Relative: 27 %
Lymphs Abs: 1.9 10*3/uL (ref 0.7–4.0)
Monocytes Absolute: 0.6 10*3/uL (ref 0.1–1.0)
Monocytes Relative: 8 %
Neutro Abs: 4.6 10*3/uL (ref 1.7–7.7)
Neutrophils Relative %: 64 %

## 2018-12-05 LAB — SARS CORONAVIRUS 2 BY RT PCR (HOSPITAL ORDER, PERFORMED IN ~~LOC~~ HOSPITAL LAB): SARS Coronavirus 2: NEGATIVE

## 2018-12-05 LAB — APTT: aPTT: 29 seconds (ref 24–36)

## 2018-12-05 LAB — ETHANOL: Alcohol, Ethyl (B): <10 mg/dL (ref <10)

## 2018-12-05 MED ORDER — PROCHLORPERAZINE EDISYLATE 10 MG/2ML IJ SOLN
10.0000 mg | Freq: Once | INTRAMUSCULAR | Status: AC
  Administered 2018-12-05: 10 mg via INTRAVENOUS
  Filled 2018-12-05: qty 2

## 2018-12-05 MED ORDER — DIPHENHYDRAMINE HCL 50 MG/ML IJ SOLN
25.0000 mg | Freq: Once | INTRAMUSCULAR | Status: AC
  Administered 2018-12-05: 22:00:00 25 mg via INTRAVENOUS
  Filled 2018-12-05: qty 1

## 2018-12-05 NOTE — Consult Note (Signed)
[facility] Tele[facility]urology Consult Services   Date of Service:   12/05/2018 22:08:28  Impression:     .  HA     .  Aphasia  Comments/Sign-Out: Patient with a history of migraine presenting with left hemicranial HA and expressive aphasia. She has never had this type of symptom with her HAs. Need to rule out left frontal cortical ischemia. Unable to get CTA here due to technical difficulty with scanner. She will be transferred to [facility] for further imaging to rule out LVO given her aphasia.  Metrics: Last Known Well: 12/05/2018 17:30:00 [facility] Notification Time: 12/05/2018 [id number] Arrival Time: 12/05/2018 21:35:00 Stamp Time: 12/05/2018 [id number] Time First Login Attempt: 12/05/2018 22:13:00 Video Start Time: 12/05/2018 22:13:00  Symptoms: aphasia, HA NIHSS Start Assessment Time: 12/05/2018 22:16:00 Patient is not a candidate for Alteplase/Activase. Patient was not deemed candidate for Alteplase/Activase thrombolytics because of Last Well Known Above 4.5 Hours. Video End Time: 12/05/2018 22:21:00  CT head showed no acute hemorrhage or acute core infarct.  Clinical Presentation is Suggestive of Large Vessel Occlusive Disease - Transfer to [facility] for CTA/CTP.   ED Physician notified of diagnostic impression and management plan on 12/05/2018 22:28:00  Our recommendations are outlined below.  Recommendations:     .  Permissive HTN for now     .  Antiplatelet Therapy Recommended     .  Tranferring to [facility] for CTA/CTP    Sign Out:     .  Discussed with Emergency Department Provider    ------------------------------------------------------------------------------  History of Present Illness: Patient is a 68 year old Female.  Patient was brought by private transportation with symptoms of aphasia, HA  Patient with a history of anxiety, HTN, HLD, migraines. She gets a rare migraine with hemicranial pain, nausea and photophobia.  Tonight she was last normal at 1730, then developed left hemicranial pain. This was accomanied by expressive language difficulty, which she's never experienced before. No weakness, vision loss/change, sensory loss, dysarthria, gait change, CP.      Examination: 1A: Level of Consciousness - Alert; keenly responsive + 0 1B: Ask Month and Age - Both Questions Right + 0 1C: Blink Eyes & Squeeze Hands - Performs Both Tasks + 0 2: Test Horizontal Extraocular Movements - Normal + 0 3: Test Visual Fields - No Visual Loss + 0 4: Test Facial Palsy (Use Grimace if Obtunded) - Normal symmetry + 0 5A: Test Left Arm Motor Drift - No Drift for 10 Seconds + 0 5B: Test Right Arm Motor Drift - No Drift for 10 Seconds + 0 6A: Test Left Leg Motor Drift - No Drift for 5 Seconds + 0 6B: Test Right Leg Motor Drift - No Drift for 5 Seconds + 0 7: Test Limb Ataxia (FNF/Heel-Shin) - No Ataxia + 0 8: Test Sensation - Normal; No sensory loss + 0 9: Test Language/Aphasia - Mild-Moderate Aphasia: Some Obvious Changes, Without Significant Limitation + 1 10: Test Dysarthria - Normal + 0 11: Test Extinction/Inattention - No abnormality + 0  NIHSS Score: 1  Patient/Family was informed the Neurology Consult would happen via TeleHealth consult by way of interactive audio and video telecommunications and consented to receiving care in this manner.   Due to the immediate potential for life-threatening deterioration due to underlying acute neurologic illness, I spent 35 minutes providing critical care. This time includes time for face to face visit via telemedicine, review of medical records, imaging studies and discussion of findings with providers, the patient and/or  family.   Dr Arne Cleveland   [facility] [phone]  Case [id number]

## 2018-12-05 NOTE — Progress Notes (Signed)
Code Stroke Times:   [id number] Exam began 2207 Exam finished 2208 Images sent, Completed in [place] - call answered @ 2215

## 2018-12-05 NOTE — ED Notes (Signed)
Patient transported to CT 

## 2018-12-05 NOTE — ED Notes (Signed)
Report given to [place] RN with [employer] 

## 2018-12-05 NOTE — ED Notes (Signed)
Pt arrived via [facility] and taken directly to MRI per dr aroor.

## 2018-12-05 NOTE — ED Notes (Signed)
ED Provider at bedside. 

## 2018-12-05 NOTE — ED Notes (Signed)
Meds pulled for RN

## 2018-12-05 NOTE — ED Notes (Signed)
Patient transported to [facility] via [facility]

## 2018-12-05 NOTE — ED Notes (Signed)
Report given to [place], Warehouse manager at [employer]

## 2018-12-05 NOTE — ED Provider Notes (Signed)
[facility] Emergency Department Provider Note MRN:  [id number]  Arrival date & time: 12/05/18     Chief Complaint   Code Stroke   History of Present Illness   Cynthia Strickland is a 68 y.o. year-old female with a history of hypertension, rheumatoid arthritis, migraines presenting to the ED with chief complaint of headache.  Patient began experiencing a headache at 5:30 PM.  Took a triptan tablet which did not help.  Took another triptan tablet.  Then began having speech issues.  Saying the wrong words.  Question of slurred speech or aphasia.  Denies numbness or weakness in the arms or legs, continued moderate severity headache, no chest pain or shortness of breath.  Review of Systems  A complete 10 system review of systems was obtained and all systems are negative except as noted in the HPI and PMH.   Patient's Health History    Past Medical History:  Diagnosis Date  . Anxiety   . Barretts esophagus   . Cataract   . GERD (gastroesophageal reflux disease)   . Hiatal hernia   . Hyperlipidemia   . Hypertension    white coat syndrome  . Migraine headache with aura   . Mitral regurgitation 05-19-11    and Tricuspid Regurgitation(had Echo w/Bubble study)for migraines  . Rheumatoid arthritis ([place]) 12/2012    Past Surgical History:  Procedure Laterality Date  . CESAREAN SECTION  1976  . COLONOSCOPY    . LAPAROSCOPIC OOPHORECTOMY  2005  . UPPER GASTROINTESTINAL ENDOSCOPY    . VAGINAL HYSTERECTOMY  1995    Family History  Problem Relation Age of Onset  . Endometrial cancer Mother   . Heart disease Father   . Heart disease Maternal Grandmother   . Stroke Maternal Grandmother   . Pancreatic cancer Maternal Grandfather   . Heart disease Paternal Grandfather   . Stroke Paternal Grandfather   . Colon cancer Neg Hx     Social History   Socioeconomic History  . Marital status: Married    Spouse name: Not on file  . Number of children: Not on  file  . Years of education: Not on file  . Highest education level: Not on file  Occupational History  . Occupation: retired - worked in Public relations account executive  Social Needs  . Financial resource strain: Not on file  . Food insecurity    Worry: Not on file    Inability: Not on file  . Transportation needs    Medical: Not on file    Non-medical: Not on file  Tobacco Use  . Smoking status: Former Smoker    Years: 13.00    Types: Cigarettes    Quit date: 03/31/1993    Years since quitting: 25.6  . Smokeless tobacco: Never Used  Substance and Sexual Activity  . Alcohol use: Yes    Alcohol/week: 2.0 standard drinks    Types: 2 Glasses of wine per week    Comment: Wine sometimes-weekends  . Drug use: No  . Sexual activity: Not on file  Lifestyle  . Physical activity    Days per week: Not on file    Minutes per session: Not on file  . Stress: Not on file  Relationships  . Social Herbalist on phone: Not on file    Gets together: Not on file    Attends religious service: Not on file    Active member of club or organization: Not on file  Attends meetings of clubs or organizations: Not on file    Relationship status: Not on file  . Intimate partner violence    Fear of current or ex partner: Not on file    Emotionally abused: Not on file    Physically abused: Not on file    Forced sexual activity: Not on file  Other Topics Concern  . Not on file  Social History Narrative   Right handed    Caffeine use: coffee very morning   No soda     Physical Exam  Vital Signs and Nursing Notes reviewed Vitals:   12/05/18 2211 12/05/18 2218  BP: (!) 193/114 (!) 191/118  Pulse: 78 82  Resp: 18 (!) 23  SpO2: 100% 100%    CONSTITUTIONAL: Well-appearing, NAD NEURO:  Alert and oriented x 3, normal and symmetric strength and sensation, normal coordination, no slurred speech but she says inaccurate words when asked to repeat phrases EYES:  eyes equal and reactive ENT/NECK:  no  LAD, no JVD CARDIO: Regular rate, well-perfused, normal S1 and S2 PULM:  CTAB no wheezing or rhonchi GI/GU:  normal bowel sounds, non-distended, non-tender MSK/SPINE:  No gross deformities, no edema SKIN:  no rash, atraumatic PSYCH:  Appropriate speech and behavior  Diagnostic and Interventional Summary    EKG Interpretation  Date/Time:  Sunday December 05 2018 22:02:26 EDT Ventricular Rate:  83 PR Interval:    QRS Duration: 86 QT Interval:  403 QTC Calculation: 474 R Axis:   -26 Text Interpretation:  Sinus rhythm Borderline left axis deviation Low voltage, precordial leads Consider anterior infarct Baseline wander in lead(s) V2 Confirmed by Gerlene Fee [phone]) on 12/05/2018 10:39:34 PM      Labs Reviewed  CBC  DIFFERENTIAL  ETHANOL  RAPID URINE DRUG SCREEN, HOSP PERFORMED  URINALYSIS, ROUTINE W REFLEX MICROSCOPIC    CT HEAD CODE STROKE WO CONTRAST  Final Result    CT Code Stroke CTA Head W/WO contrast    (Results Pending)  CT Code Stroke CTA Neck W/WO contrast    (Results Pending)    Medications  diphenhydrAMINE (BENADRYL) injection 25 mg (25 mg Intravenous Given 12/05/18 2227)  prochlorperazine (COMPAZINE) injection 10 mg (10 mg Intravenous Given 12/05/18 2227)     Procedures Critical Care Critical Care Documentation Critical care time provided by me (excluding procedures): 38 minutes  Condition necessitating critical care: Concern for acute ischemic stroke  Components of critical care management: reviewing of prior records, laboratory and imaging interpretation, frequent re-examination and reassessment of vital signs, discussion with consulting services, facilitation of transfer.    ED Course and Medical Decision Making  I have reviewed the triage vital signs and the nursing notes.  Pertinent labs & imaging results that were available during my care of the patient were reviewed by me and considered in my medical decision making (see below for details).   Concern for aphasia, more likely to be complex migraine given her history of migraines but cannot exclude acute stroke, possibly large vessel.  Code stroke initiated given the last known normal is 4 and half hours ago.  Awaiting tele-neurologist evaluation and CT Noncon.  We are unable to perform CTA imaging here at [facility] today, will likely need swift transport to [facility].  Tele-neurology is recommending CTA imaging, outside of the TPA window.  Discussed case with Dr. Malen Gauze neurology, who agrees with rapid ED to ED transport for evaluation and CTA imaging.  Accepting physician Dr. Grayce Sessions of the [facility] emergency  department.  Barth Kirks. Sedonia Small, [place] [email or web address]  Final Clinical Impressions(s) / ED Diagnoses     ICD-10-CM   1. Aphasia  R47.01     ED Discharge Orders    None      Discharge Instructions Discussed with and Provided to Patient: Discharge Instructions   None       Maudie Flakes, MD 12/05/18 2240

## 2018-12-05 NOTE — ED Triage Notes (Signed)
Patient states that she has a Headache starting at 1730 - the patient patient states that she took 2 Relpax, patient states that she is having trouble concentrating and following commands - the patient denies any weakness or dizziness at this time. The patient has no noted drift or weakness on one side or the other

## 2018-12-05 NOTE — ED Notes (Signed)
RN at the bedside during the entire Code Stroke

## 2018-12-05 NOTE — Progress Notes (Signed)
Upon arrival to [facility], Dr. Lorraine Lax examined patient at the bridge. No obvious focal deficit found. NIHSS 0. Pt taken to MRI/A brain.

## 2018-12-06 DIAGNOSIS — R4701 Aphasia: Secondary | ICD-10-CM | POA: Diagnosis not present

## 2018-12-06 DIAGNOSIS — G43109 Migraine with aura, not intractable, without status migrainosus: Secondary | ICD-10-CM

## 2018-12-06 LAB — URINALYSIS, ROUTINE W REFLEX MICROSCOPIC
Bilirubin Urine: NEGATIVE
Glucose, UA: NEGATIVE mg/dL
Hgb urine dipstick: NEGATIVE
Ketones, ur: NEGATIVE mg/dL
Leukocytes,Ua: NEGATIVE
Nitrite: NEGATIVE
Protein, ur: NEGATIVE mg/dL
Specific Gravity, Urine: 1.006 (ref 1.005–1.030)
pH: 7 (ref 5.0–8.0)

## 2018-12-06 LAB — RAPID URINE DRUG SCREEN, HOSP PERFORMED
Amphetamines: NOT DETECTED
Barbiturates: NOT DETECTED
Benzodiazepines: NOT DETECTED
Cocaine: NOT DETECTED
Opiates: NOT DETECTED
Tetrahydrocannabinol: NOT DETECTED

## 2018-12-06 MED ORDER — VALPROATE SODIUM 500 MG/5ML IV SOLN
500.0000 mg | Freq: Once | INTRAVENOUS | Status: AC
  Administered 2018-12-06: 04:00:00 500 mg via INTRAVENOUS
  Filled 2018-12-06: qty 5

## 2018-12-06 MED ORDER — DEXAMETHASONE SODIUM PHOSPHATE 10 MG/ML IJ SOLN
10.0000 mg | Freq: Once | INTRAMUSCULAR | Status: AC
  Administered 2018-12-06: 02:00:00 10 mg via INTRAVENOUS
  Filled 2018-12-06: qty 1

## 2018-12-06 MED ORDER — KETOROLAC TROMETHAMINE 15 MG/ML IJ SOLN
15.0000 mg | Freq: Once | INTRAMUSCULAR | Status: AC
  Administered 2018-12-06: 15 mg via INTRAVENOUS
  Filled 2018-12-06: qty 1

## 2018-12-06 NOTE — Discharge Instructions (Addendum)
1. Medications: usual home medications 2. Treatment: rest, drink plenty of fluids,  3. Follow Up: Please followup with your primary doctor in 2-3 days for reevaluation after today's ER visit; Please return to the ER for return of headaches, lightheadedness, difficulty speaking, numbness, weakness or other concerns.

## 2018-12-06 NOTE — ED Notes (Signed)
Patient verbalized understanding of discharge instructions. Opportunity for questions were provided. Pt. ambulatory and discharged home.  

## 2018-12-06 NOTE — Consult Note (Signed)
Requesting Physician: Dr. Wyvonnia Dusky    Chief Complaint: headache, apahasia  History obtained from: Patient and Chart     HPI:                                                                                                                                       Cynthia Strickland is a 68 y.o. female with past medical history significant for hypertension, hyperlipidemia, migraine, anxiety, mitral regurgitation presents to the ED at [facility] after having some headache and difficulty with language.  Patient has known history of migraines however has never had language issues in the past. Around 5.30 pm, patient complained of migraine.  Patient has recently seen Dr. Felecia Shelling and was prescribed Relpax  (triptan for migraine abortion ). Patient took Relpax, however headache continued and so she decided to take another 1.  Shortly after, patient started having difficulty with her language which prompted her husband to take the patient to [place].  Evaluated by tele-neurology who recommended stat CT angiogram to rule out LVO.  Patient did not receive IV TPA as she is outside the window.  As CT angiogram was not available patient transferred to [facility] emergency room.   ED course [place]  On arrival,, patient was taken to MRI directly.  MRI brain was negative for acute stroke MRA was negative for LVO.  Patient continued to complain of headache that was unilateral, on the left side associated nausea and typical for her migraines.  NIHSS was 0.      Past Medical History:  Diagnosis Date  . Anxiety   . Barretts esophagus   . Cataract   . GERD (gastroesophageal reflux disease)   . Hiatal hernia   . Hyperlipidemia   . Hypertension    white coat syndrome  . Migraine headache with aura   . Mitral regurgitation 05-19-11    and Tricuspid Regurgitation(had Echo w/Bubble study)for migraines  . Rheumatoid arthritis ([place]) 12/2012    Past Surgical History:  Procedure Laterality Date   . CESAREAN SECTION  1976  . COLONOSCOPY    . LAPAROSCOPIC OOPHORECTOMY  2005  . UPPER GASTROINTESTINAL ENDOSCOPY    . VAGINAL HYSTERECTOMY  1995    Family History  Problem Relation Age of Onset  . Endometrial cancer Mother   . Heart disease Father   . Heart disease Maternal Grandmother   . Stroke Maternal Grandmother   . Pancreatic cancer Maternal Grandfather   . Heart disease Paternal Grandfather   . Stroke Paternal Grandfather   . Colon cancer Neg Hx    Social History:  reports that she quit smoking about 25 years ago. Her smoking use included cigarettes. She quit after 13.00 years of use. She has never used smokeless tobacco. She reports current alcohol use of about 2.0 standard drinks of alcohol per week. She reports that she does not use drugs.  Allergies:  Allergies  Allergen Reactions  . Codeine Nausea Only and Nausea And Vomiting  . Sulfa Antibiotics Rash  . Sulfonamide Derivatives Rash    Medications:                                                                                                                        I reviewed home medications   ROS:                                                                                                                                     14 systems reviewed and negative except above    Examination:                                                                                                      General: Appears well-developed and well-nourished.  Psych: Affect appropriate to situation Eyes: No scleral injection HENT: No OP obstrucion Head: Normocephalic.  Cardiovascular: Normal rate and regular rhythm.  Respiratory: Effort normal and breath sounds normal to anterior ascultation GI: Soft.  No distension. There is no tenderness.  Skin: WDI    Neurological Examination Mental Status: Alert, oriented, thought content appropriate.  Speech fluent, slightly slow in processing and expressing words.  Able to follow 3  step commands without difficulty. Cranial Nerves: II: Visual fields grossly normal,  III,IV, VI: ptosis not present, extra-ocular motions intact bilaterally, pupils equal, round, reactive to light and accommodation V,VII: smile symmetric, facial light touch sensation normal bilaterally VIII: hearing normal bilaterally IX,X: uvula rises symmetrically XI: bilateral shoulder shrug XII: midline tongue extension Motor: Right : Upper extremity   5/5    Left:     Upper extremity   5/5  Lower extremity   5/5     Lower extremity   5/5 Tone and bulk:normal tone throughout; no atrophy noted Sensory: Pinprick and light touch intact throughout, bilaterally Deep Tendon Reflexes: 2+ and symmetric throughout Plantars: Right: downgoing   Left: downgoing Cerebellar: normal finger-to-nose, normal  rapid alternating movements and normal heel-to-shin test Gait: normal gait and station     Lab Results: Basic Metabolic Panel: Recent Labs  Lab 12/05/18 2243  NA 140  K 3.4*  CL 104  CO2 23  GLUCOSE 100*  BUN 16  CREATININE 0.78  CALCIUM 9.2    CBC: Recent Labs  Lab 12/05/18 July 04, 2219  WBC 7.3  NEUTROABS 4.6  HGB 13.7  HCT 42.1  MCV 90.0  PLT 245    Coagulation Studies: Recent Labs    12/05/18 July 03, 2241  LABPROT 13.5  INR 1.0    Imaging: Mr Angio Head Wo Contrast  Result Date: 12/06/2018 CLINICAL DATA:  Initial evaluation for acute left hemi cranial headache, expressive aphasia. History of migraines. EXAM: MRI HEAD WITHOUT CONTRAST MRA HEAD WITHOUT CONTRAST TECHNIQUE: Multiplanar, multiecho pulse sequences of the brain and surrounding structures were obtained without intravenous contrast. Angiographic images of the head were obtained using MRA technique without contrast. COMPARISON:  Prior CTs from 12/05/2018. FINDINGS: MRI HEAD FINDINGS Brain: Cerebral volume within normal limits for age. Patchy and confluent T2/FLAIR hyperintensity within the periventricular and deep white matter of both  cerebral hemispheres as well as the pons, most consistent with chronic small vessel ischemic disease, moderate in nature. No abnormal foci of restricted diffusion to suggest acute or subacute ischemia. Gray-white matter differentiation maintained. No encephalomalacia to suggest chronic cortical infarction. No foci of susceptibility artifact to suggest acute or chronic intracranial hemorrhage. No mass lesion, midline shift or mass effect. No hydrocephalus. No extra-axial fluid collection. Pituitary gland suprasellar region normal. Midline structures intact. Vascular: Major intracranial vascular flow voids are maintained. Skull and upper cervical spine: Craniocervical junction normal. Bone marrow signal intensity within normal limits. No scalp soft tissue abnormality. Sinuses/Orbits: Globes and orbital soft tissues within normal limits. Paranasal sinuses are largely clear. No significant mastoid effusion. Inner ear structures normal. Other: None. MRA HEAD FINDINGS ANTERIOR CIRCULATION: Distal cervical segments of the internal carotid arteries are widely patent with symmetric antegrade flow. Petrous, cavernous, and supraclinoid ICAs widely patent without stenosis or other abnormality. A1 segments widely patent. Normal anterior communicating artery. Anterior cerebral arteries widely patent to their distal aspects without stenosis. M1 segments widely patent. Normal MCA bifurcations. Distal MCA branches well perfused and symmetric. POSTERIOR CIRCULATION: Vertebral arteries widely patent to the vertebrobasilar junction without stenosis. Right vertebral artery slightly dominant. Posterior inferior cerebral arteries patent bilaterally. Basilar widely patent to its distal aspect without stenosis. Superior cerebral arteries patent bilaterally. Both of the posterior cerebral arteries primarily supplied via the basilar and are well perfused to their distal aspects. No intracranial aneurysm or other vascular abnormality.  IMPRESSION: MRI HEAD IMPRESSION: 1. No acute intracranial infarct or other abnormality identified. 2. Moderate chronic microvascular ischemic disease for age. MRA HEAD IMPRESSION: Normal intracranial MRA. No large vessel occlusion, hemodynamically significant stenosis, or other acute vascular abnormality. No aneurysm. Electronically Signed   By: Jeannine Boga M.D.   On: 12/06/2018 00:37   Mr Brain Wo Contrast  Result Date: 12/06/2018 CLINICAL DATA:  Initial evaluation for acute left hemi cranial headache, expressive aphasia. History of migraines. EXAM: MRI HEAD WITHOUT CONTRAST MRA HEAD WITHOUT CONTRAST TECHNIQUE: Multiplanar, multiecho pulse sequences of the brain and surrounding structures were obtained without intravenous contrast. Angiographic images of the head were obtained using MRA technique without contrast. COMPARISON:  Prior CTs from 12/05/2018. FINDINGS: MRI HEAD FINDINGS Brain: Cerebral volume within normal limits for age. Patchy and confluent T2/FLAIR hyperintensity within the periventricular and deep white matter  of both cerebral hemispheres as well as the pons, most consistent with chronic small vessel ischemic disease, moderate in nature. No abnormal foci of restricted diffusion to suggest acute or subacute ischemia. Gray-white matter differentiation maintained. No encephalomalacia to suggest chronic cortical infarction. No foci of susceptibility artifact to suggest acute or chronic intracranial hemorrhage. No mass lesion, midline shift or mass effect. No hydrocephalus. No extra-axial fluid collection. Pituitary gland suprasellar region normal. Midline structures intact. Vascular: Major intracranial vascular flow voids are maintained. Skull and upper cervical spine: Craniocervical junction normal. Bone marrow signal intensity within normal limits. No scalp soft tissue abnormality. Sinuses/Orbits: Globes and orbital soft tissues within normal limits. Paranasal sinuses are largely clear. No  significant mastoid effusion. Inner ear structures normal. Other: None. MRA HEAD FINDINGS ANTERIOR CIRCULATION: Distal cervical segments of the internal carotid arteries are widely patent with symmetric antegrade flow. Petrous, cavernous, and supraclinoid ICAs widely patent without stenosis or other abnormality. A1 segments widely patent. Normal anterior communicating artery. Anterior cerebral arteries widely patent to their distal aspects without stenosis. M1 segments widely patent. Normal MCA bifurcations. Distal MCA branches well perfused and symmetric. POSTERIOR CIRCULATION: Vertebral arteries widely patent to the vertebrobasilar junction without stenosis. Right vertebral artery slightly dominant. Posterior inferior cerebral arteries patent bilaterally. Basilar widely patent to its distal aspect without stenosis. Superior cerebral arteries patent bilaterally. Both of the posterior cerebral arteries primarily supplied via the basilar and are well perfused to their distal aspects. No intracranial aneurysm or other vascular abnormality. IMPRESSION: MRI HEAD IMPRESSION: 1. No acute intracranial infarct or other abnormality identified. 2. Moderate chronic microvascular ischemic disease for age. MRA HEAD IMPRESSION: Normal intracranial MRA. No large vessel occlusion, hemodynamically significant stenosis, or other acute vascular abnormality. No aneurysm. Electronically Signed   By: Jeannine Boga M.D.   On: 12/06/2018 00:37   Ct Head Code Stroke Wo Contrast  Result Date: 12/05/2018 CLINICAL DATA:  Code stroke. New onset headache beginning at 5:30 p.m. Difficulty concentrating. EXAM: CT HEAD WITHOUT CONTRAST TECHNIQUE: Contiguous axial images were obtained from the base of the skull through the vertex without intravenous contrast. COMPARISON:  Report of MR head without contrast 05/12/2011. Images are not currently available FINDINGS: Brain: No acute cortical infarct is present. Basal ganglia are intact. Insular  ribbon is normal bilaterally. Moderate periventricular white matter hypoattenuation is present bilaterally. Brainstem and cerebellum are normal. Calcifications are noted at the pineal gland. Vascular: Atherosclerotic changes are noted within the cavernous internal carotid arteries bilaterally. There is no hyperdense vessel. Skull: Calvarium is intact. No focal lytic or blastic lesions are present. Sinuses/Orbits: The paranasal sinuses and mastoid air cells are clear. The globes and orbits are within normal limits. ASPECTS [facility] Stroke Program Early CT Score) - Ganglionic level infarction (caudate, lentiform nuclei, internal capsule, insula, M1-M3 cortex): 7/7 - Supraganglionic infarction (M4-M6 cortex): 3/3 Total score (0-10 with 10 being normal): 10/10 IMPRESSION: 1. Periventricular and subcortical white matter changes are moderately advanced for age. This likely reflects the sequela of chronic microvascular ischemia. 2. No acute cortical infarct. 3. ASPECTS is 10/10 These results were called by telephone at the time of interpretation on 12/05/2018 at 10:13 pm to Dr. Gerlene Fee , who verbally acknowledged these results. Electronically Signed   By: San Morelle M.D.   On: 12/05/2018 22:13     I have reviewed the above imaging : MRI brain and MR angiogram   ASSESSMENT AND PLAN   68 y.o. female with past medical history significant for hypertension, hyperlipidemia,  migraine, anxiety, mitral regurgitation presents to the ED at [facility] after having some headache and difficulty with language. Patient was stroke alerted for expressive aphasia: However on arrival MRI brain and MR angiogram negative for stroke.  While symptoms has improved she was not back to her baseline, recommended migraine cocktail which resolved headache and language is returned to normal.  Based on description of events by her husband, I think this was a migraine and she may have had a adverse reaction to  Relpax.  Complicated Migraine - Avoid triptans  - F/U with Dr. Felecia Shelling for migraine management    Kassidy Frankson [facility] Pager Number [phone]

## 2018-12-06 NOTE — ED Provider Notes (Signed)
[facility] EMERGENCY DEPARTMENT Provider Note   CSN: [id number] Arrival date & time: 12/05/18  2135  An emergency department physician performed an initial assessment on this suspected stroke patient at 2200.  History   Chief Complaint Chief Complaint  Patient presents with   Code Stroke    HPI Cynthia Strickland is a 68 y.o. female with a hx of anxiety, Barrett's esophagus, GERD, hypertension, mitral regurg, rheumatoid arthritis presents to the Emergency Department complaining of gradual, persistent, progressively worsening left-sided headache onset at 5:30 PM.  Patient reports that her headache feels similar to previous migraines however is more intense.  She reports taking her triptan x2 at home without any improvement.  Afterwards, patient reports developing speech issues.  She reports that she understood will be forcing to her but she was having difficulty communicating with her words.  She also reports she was having difficulty typing out her thoughts on her computer.  This prompted her to present to [place].  Patient was evaluated there by Dr. Maurie Boettcher and code stroke was initiated.  Initial CT scan was negative.  She was transferred here to [facility] for MRI.  On arrival she was evaluated by Dr. Lorraine Lax.       The history is provided by the patient and medical records. No language interpreter was used.    Past Medical History:  Diagnosis Date   Anxiety    Barretts esophagus    Cataract    GERD (gastroesophageal reflux disease)    Hiatal hernia    Hyperlipidemia    Hypertension    white coat syndrome   Migraine headache with aura    Mitral regurgitation 05-19-11    and Tricuspid Regurgitation(had Echo w/Bubble study)for migraines   Rheumatoid arthritis ([place]) 12/2012    Patient Active Problem List   Diagnosis Date Noted   Rheumatoid arthritis ([place]) 08/30/2018   Neck pain 08/30/2018   GERD (gastroesophageal reflux disease)  12/05/2015   PFO (patent foramen ovale) 07/29/2011   BARRETTS ESOPHAGUS 07/06/2009   UNSPECIFIED CONSTIPATION 06/04/2009   HYPERLIPIDEMIA 05/31/2009   ANXIETY 05/31/2009   MIGRAINE, CHRONIC 05/31/2009   ABDOMINAL BLOATING 05/31/2009   PURE HYPERCHOLESTEROLEMIA 05/09/2009   CHEST PAIN 05/09/2009    Past Surgical History:  Procedure Laterality Date   CESAREAN SECTION  1976   COLONOSCOPY     LAPAROSCOPIC OOPHORECTOMY  2005   UPPER GASTROINTESTINAL ENDOSCOPY     VAGINAL HYSTERECTOMY  1995     OB History   No obstetric history on file.      Home Medications    Prior to Admission medications   Medication Sig Start Date End Date Taking? Authorizing Provider  DULoxetine (CYMBALTA) 30 MG capsule Take 30 mg by mouth daily.  07/04/16  Yes [provider]  eletriptan (RELPAX) 40 MG tablet Take 1 tablet (40 mg total) by mouth as needed for migraine or headache. May repeat in 2 hours if headache persists or recurs. 08/30/18  Yes Sater, Nanine Means, MD  meloxicam (MOBIC) 7.5 MG tablet Take 1 tablet (7.5 mg total) by mouth daily. Patient taking differently: Take 7.5 mg by mouth daily as needed for pain.  08/30/18  Yes Sater, Nanine Means, MD  omeprazole (PRILOSEC) 40 MG capsule TAKE ONE CAPSULE BY MOUTH TWICE A DAY Patient taking differently: Take 40 mg by mouth daily.  07/23/18  Yes Armbruster, Carlota Raspberry, MD  simvastatin (ZOCOR) 20 MG tablet Take 20 mg by mouth daily at 6 PM.  06/27/16  Yes [provider]  phentermine 30 MG capsule Take 1 capsule (30 mg total) by mouth every morning. Patient not taking: Reported on 12/06/2018 09/02/18   Sater, Nanine Means, MD  predniSONE (STERAPRED UNI-PAK 21 TAB) 10 MG (21) TBPK tablet 6 pills po x 1 day, then 5 pills po x 1 day, then 4 pills po x 1 day, then 3 pills po x 1 day, then 2 pills po x 1 d, then 1 pill po x 1 d Patient not taking: Reported on 12/06/2018 08/30/18   Sater, Nanine Means, MD  promethazine (PHENERGAN) 12.5 MG tablet Take 1  tablet (12.5 mg total) by mouth every 6 (six) hours as needed for nausea or vomiting. Patient not taking: Reported on 12/06/2018 08/30/18   Sater, Nanine Means, MD  sucralfate (CARAFATE) 1 GM/10ML suspension Take 10 mLs (1 g total) by mouth as needed. Patient not taking: Reported on 12/06/2018 12/05/15   Armbruster, Carlota Raspberry, MD    Family History Family History  Problem Relation Age of Onset   Endometrial cancer Mother    Heart disease Father    Heart disease Maternal Grandmother    Stroke Maternal Grandmother    Pancreatic cancer Maternal Grandfather    Heart disease Paternal Grandfather    Stroke Paternal Grandfather    Colon cancer Neg Hx     Social History Social History   Tobacco Use   Smoking status: Former Smoker    Years: 13.00    Types: Cigarettes    Quit date: 03/31/1993    Years since quitting: 25.7   Smokeless tobacco: Never Used  Substance Use Topics   Alcohol use: Yes    Alcohol/week: 2.0 standard drinks    Types: 2 Glasses of wine per week    Comment: Wine sometimes-weekends   Drug use: No     Allergies   Codeine, Sulfa antibiotics, and Sulfonamide derivatives   Review of Systems Review of Systems  Constitutional: Negative for appetite change, diaphoresis, fatigue, fever and unexpected weight change.  HENT: Negative for mouth sores.   Eyes: Negative for visual disturbance.  Respiratory: Negative for cough, chest tightness, shortness of breath and wheezing.   Cardiovascular: Negative for chest pain.  Gastrointestinal: Negative for abdominal pain, constipation, diarrhea, nausea and vomiting.  Endocrine: Negative for polydipsia, polyphagia and polyuria.  Genitourinary: Negative for dysuria, frequency, hematuria and urgency.  Musculoskeletal: Negative for back pain and neck stiffness.  Skin: Negative for rash.  Allergic/Immunologic: Negative for immunocompromised state.  Neurological: Positive for speech difficulty and headaches. Negative for syncope  and light-headedness.  Hematological: Does not bruise/bleed easily.  Psychiatric/Behavioral: Negative for sleep disturbance. The patient is not nervous/anxious.      Physical Exam Updated Vital Signs BP (!) 194/112    Pulse 75    Temp 98.5 F (36.9 C)    Resp (!) 26    Ht 5' (1.524 m)    Wt 70.3 kg    SpO2 98%    BMI 30.27 kg/m   Physical Exam Vitals signs and nursing note reviewed.  Constitutional:      General: She is not in acute distress.    Appearance: She is well-developed.  HENT:     Head: Normocephalic.  Eyes:     General: No scleral icterus.    Conjunctiva/sclera: Conjunctivae normal.  Neck:     Musculoskeletal: Normal range of motion.  Cardiovascular:     Rate and Rhythm: Normal rate.  Pulmonary:     Effort: Pulmonary  effort is normal.  Musculoskeletal: Normal range of motion.     Comments: Moves extremities equally and without difficulty  Skin:    General: Skin is warm and dry.  Neurological:     Mental Status: She is alert.     Comments: Occasional word finding difficulty but no slurred or garbled speech. No facial droop.  No pronator drift.  No visual field abnormalities.  No neglect. Sensation grossly intact to the bilateral upper and lower extremities. Strength 5/5 in the bilateral upper and lower extremities. Normal finger-to-nose bilaterally.      ED Treatments / Results  Labs (all labs ordered are listed, but only abnormal results are displayed) Labs Reviewed  COMPREHENSIVE METABOLIC PANEL - Abnormal; Notable for the following components:      Result Value   Potassium 3.4 (*)    Glucose, Bld 100 (*)    All other components within normal limits  URINALYSIS, ROUTINE W REFLEX MICROSCOPIC - Abnormal; Notable for the following components:   Color, Urine STRAW (*)    All other components within normal limits  SARS CORONAVIRUS 2 (HOSPITAL ORDER, [place] [facility])  ETHANOL  CBC  DIFFERENTIAL  PROTIME-INR  APTT  RAPID URINE  DRUG SCREEN, HOSP PERFORMED    EKG EKG Interpretation  Date/Time:  Sunday December 05 2018 22:02:26 EDT Ventricular Rate:  83 PR Interval:    QRS Duration: 86 QT Interval:  403 QTC Calculation: 474 R Axis:   -26 Text Interpretation:  Sinus rhythm Borderline left axis deviation Low voltage, precordial leads Consider anterior infarct Baseline wander in lead(s) V2 Confirmed by Gerlene Fee [phone]) on 12/05/2018 10:39:34 PM   Radiology Mr Angio Head Wo Contrast  Result Date: 12/06/2018 CLINICAL DATA:  Initial evaluation for acute left hemi cranial headache, expressive aphasia. History of migraines. EXAM: MRI HEAD WITHOUT CONTRAST MRA HEAD WITHOUT CONTRAST TECHNIQUE: Multiplanar, multiecho pulse sequences of the brain and surrounding structures were obtained without intravenous contrast. Angiographic images of the head were obtained using MRA technique without contrast. COMPARISON:  Prior CTs from 12/05/2018. FINDINGS: MRI HEAD FINDINGS Brain: Cerebral volume within normal limits for age. Patchy and confluent T2/FLAIR hyperintensity within the periventricular and deep white matter of both cerebral hemispheres as well as the pons, most consistent with chronic small vessel ischemic disease, moderate in nature. No abnormal foci of restricted diffusion to suggest acute or subacute ischemia. Gray-white matter differentiation maintained. No encephalomalacia to suggest chronic cortical infarction. No foci of susceptibility artifact to suggest acute or chronic intracranial hemorrhage. No mass lesion, midline shift or mass effect. No hydrocephalus. No extra-axial fluid collection. Pituitary gland suprasellar region normal. Midline structures intact. Vascular: Major intracranial vascular flow voids are maintained. Skull and upper cervical spine: Craniocervical junction normal. Bone marrow signal intensity within normal limits. No scalp soft tissue abnormality. Sinuses/Orbits: Globes and orbital soft tissues  within normal limits. Paranasal sinuses are largely clear. No significant mastoid effusion. Inner ear structures normal. Other: None. MRA HEAD FINDINGS ANTERIOR CIRCULATION: Distal cervical segments of the internal carotid arteries are widely patent with symmetric antegrade flow. Petrous, cavernous, and supraclinoid ICAs widely patent without stenosis or other abnormality. A1 segments widely patent. Normal anterior communicating artery. Anterior cerebral arteries widely patent to their distal aspects without stenosis. M1 segments widely patent. Normal MCA bifurcations. Distal MCA branches well perfused and symmetric. POSTERIOR CIRCULATION: Vertebral arteries widely patent to the vertebrobasilar junction without stenosis. Right vertebral artery slightly dominant. Posterior inferior cerebral arteries patent bilaterally. Basilar widely patent  to its distal aspect without stenosis. Superior cerebral arteries patent bilaterally. Both of the posterior cerebral arteries primarily supplied via the basilar and are well perfused to their distal aspects. No intracranial aneurysm or other vascular abnormality. IMPRESSION: MRI HEAD IMPRESSION: 1. No acute intracranial infarct or other abnormality identified. 2. Moderate chronic microvascular ischemic disease for age. MRA HEAD IMPRESSION: Normal intracranial MRA. No large vessel occlusion, hemodynamically significant stenosis, or other acute vascular abnormality. No aneurysm. Electronically Signed   By: Jeannine Boga M.D.   On: 12/06/2018 00:37   Mr Brain Wo Contrast  Result Date: 12/06/2018 CLINICAL DATA:  Initial evaluation for acute left hemi cranial headache, expressive aphasia. History of migraines. EXAM: MRI HEAD WITHOUT CONTRAST MRA HEAD WITHOUT CONTRAST TECHNIQUE: Multiplanar, multiecho pulse sequences of the brain and surrounding structures were obtained without intravenous contrast. Angiographic images of the head were obtained using MRA technique without  contrast. COMPARISON:  Prior CTs from 12/05/2018. FINDINGS: MRI HEAD FINDINGS Brain: Cerebral volume within normal limits for age. Patchy and confluent T2/FLAIR hyperintensity within the periventricular and deep white matter of both cerebral hemispheres as well as the pons, most consistent with chronic small vessel ischemic disease, moderate in nature. No abnormal foci of restricted diffusion to suggest acute or subacute ischemia. Gray-white matter differentiation maintained. No encephalomalacia to suggest chronic cortical infarction. No foci of susceptibility artifact to suggest acute or chronic intracranial hemorrhage. No mass lesion, midline shift or mass effect. No hydrocephalus. No extra-axial fluid collection. Pituitary gland suprasellar region normal. Midline structures intact. Vascular: Major intracranial vascular flow voids are maintained. Skull and upper cervical spine: Craniocervical junction normal. Bone marrow signal intensity within normal limits. No scalp soft tissue abnormality. Sinuses/Orbits: Globes and orbital soft tissues within normal limits. Paranasal sinuses are largely clear. No significant mastoid effusion. Inner ear structures normal. Other: None. MRA HEAD FINDINGS ANTERIOR CIRCULATION: Distal cervical segments of the internal carotid arteries are widely patent with symmetric antegrade flow. Petrous, cavernous, and supraclinoid ICAs widely patent without stenosis or other abnormality. A1 segments widely patent. Normal anterior communicating artery. Anterior cerebral arteries widely patent to their distal aspects without stenosis. M1 segments widely patent. Normal MCA bifurcations. Distal MCA branches well perfused and symmetric. POSTERIOR CIRCULATION: Vertebral arteries widely patent to the vertebrobasilar junction without stenosis. Right vertebral artery slightly dominant. Posterior inferior cerebral arteries patent bilaterally. Basilar widely patent to its distal aspect without stenosis.  Superior cerebral arteries patent bilaterally. Both of the posterior cerebral arteries primarily supplied via the basilar and are well perfused to their distal aspects. No intracranial aneurysm or other vascular abnormality. IMPRESSION: MRI HEAD IMPRESSION: 1. No acute intracranial infarct or other abnormality identified. 2. Moderate chronic microvascular ischemic disease for age. MRA HEAD IMPRESSION: Normal intracranial MRA. No large vessel occlusion, hemodynamically significant stenosis, or other acute vascular abnormality. No aneurysm. Electronically Signed   By: Jeannine Boga M.D.   On: 12/06/2018 00:37   Ct Head Code Stroke Wo Contrast  Result Date: 12/05/2018 CLINICAL DATA:  Code stroke. New onset headache beginning at 5:30 p.m. Difficulty concentrating. EXAM: CT HEAD WITHOUT CONTRAST TECHNIQUE: Contiguous axial images were obtained from the base of the skull through the vertex without intravenous contrast. COMPARISON:  Report of MR head without contrast 05/12/2011. Images are not currently available FINDINGS: Brain: No acute cortical infarct is present. Basal ganglia are intact. Insular ribbon is normal bilaterally. Moderate periventricular white matter hypoattenuation is present bilaterally. Brainstem and cerebellum are normal. Calcifications are noted at the pineal gland. Vascular:  Atherosclerotic changes are noted within the cavernous internal carotid arteries bilaterally. There is no hyperdense vessel. Skull: Calvarium is intact. No focal lytic or blastic lesions are present. Sinuses/Orbits: The paranasal sinuses and mastoid air cells are clear. The globes and orbits are within normal limits. ASPECTS [facility] Stroke Program Early CT Score) - Ganglionic level infarction (caudate, lentiform nuclei, internal capsule, insula, M1-M3 cortex): 7/7 - Supraganglionic infarction (M4-M6 cortex): 3/3 Total score (0-10 with 10 being normal): 10/10 IMPRESSION: 1. Periventricular and subcortical white matter  changes are moderately advanced for age. This likely reflects the sequela of chronic microvascular ischemia. 2. No acute cortical infarct. 3. ASPECTS is 10/10 These results were called by telephone at the time of interpretation on 12/05/2018 at 10:13 pm to Dr. Gerlene Fee , who verbally acknowledged these results. Electronically Signed   By: San Morelle M.D.   On: 12/05/2018 22:13    Procedures Procedures (including critical care time)  Medications Ordered in ED Medications  diphenhydrAMINE (BENADRYL) injection 25 mg (25 mg Intravenous Given 12/05/18 2227)  prochlorperazine (COMPAZINE) injection 10 mg (10 mg Intravenous Given 12/05/18 2227)  ketorolac (TORADOL) 15 MG/ML injection 15 mg (15 mg Intravenous Given 12/06/18 0130)  dexamethasone (DECADRON) injection 10 mg (10 mg Intravenous Given 12/06/18 0130)  valproate (DEPACON) 500 mg in dextrose 5 % 50 mL IVPB (0 mg Intravenous Stopped 12/06/18 0430)     Initial Impression / Assessment and Plan / ED Course  I have reviewed the triage vital signs and the nursing notes.  Pertinent labs & imaging results that were available during my care of the patient were reviewed by me and considered in my medical decision making (see chart for details).  Clinical Course as of Dec 06 439  Mon Dec 06, 2018  0015 Pt with significant hypertension.  She reports this is stress/anxiety/white coat syndrome.  Will control pain and reassess.  BP(!): 194/112 [HM]  0045 MRI without acute abnormality.  Will treat as complicated migraine. Pt rates her headache at a 9/10.  Will give toradol and decadron.   [HM]  0200 Patient reports headache is a 6/10.  She is no longer having any additional word finding difficulties but does wish for some additional medication.  Depacon ordered   [HM]  [id number] Patient reports headache is a 3/10 at this time.  She wishes for discharge home.  No additional word finding difficulties.   [HM]  [id number] Somewhat improved.  BP(!): 171/98 [HM]    0439 Patient reports complete resolution of pain.   [HM]    Clinical Course User Index [HM] Jemal Miskell, Jarrett [place], [place]        Patient presents with headache and word finding difficulties.  Code stroke initiated.  CT without acute intracranial hemorrhage.  Patient transferred to [facility] and emergent MRI performed.  MRI without acute findings or evidence of CVA.  Suspect complicated migraine.  Discussed with Dr. Lorraine Lax who also evaluated the patient and agrees.  She will be treated and reassessed.  4:40 AM Headache has resolved.  No evidence of stroke on MRI.  No persistent aphasia or word finding difficulties.  Patient with normal neurologic exam here in the emergency department.  She does remain somewhat hypertensive however she is well-appearing and she continues to state that this is simply her white coat syndrome.  She does not wish to have any further work-up.  Will discharge home.  She is to have close primary care follow-up within 2-3 days for further evaluation and repeat  check of her blood pressure.  Discussed reasons to return immediately to the emergency department.  Patient states understanding and is in agreement with the plan.  The patient was discussed with and seen by Dr. Wyvonnia Dusky who agrees with the treatment plan.   Final Clinical Impressions(s) / ED Diagnoses   Final diagnoses:  Aphasia  Complicated migraine    ED Discharge Orders    None       Loni Muse Gwenlyn Perking 12/06/18 0441    Ezequiel Essex, MD 12/06/18 [id number]

## 2018-12-11 ENCOUNTER — Other Ambulatory Visit: Payer: Self-pay | Admitting: Neurology

## 2018-12-11 DIAGNOSIS — G43711 Chronic migraine without aura, intractable, with status migrainosus: Secondary | ICD-10-CM

## 2018-12-11 MED ORDER — NURTEC 75 MG PO TBDP: 75.0000 mg | ORAL_TABLET | Freq: Every day | ORAL | 6 refills | Status: DC | PRN

## 2018-12-14 ENCOUNTER — Telehealth: Payer: Self-pay | Admitting: *Deleted

## 2018-12-14 NOTE — Telephone Encounter (Signed)
Called, LVM letting pt know I was calling to offer 9am appt with Dr. Felecia Shelling today d/t him having cx. I will keep her on wait list and call if anything else comes up.

## 2019-01-13 ENCOUNTER — Encounter: Payer: Self-pay | Admitting: Neurology

## 2019-01-13 ENCOUNTER — Other Ambulatory Visit: Payer: Self-pay

## 2019-01-13 ENCOUNTER — Ambulatory Visit (INDEPENDENT_AMBULATORY_CARE_PROVIDER_SITE_OTHER): Payer: Medicare Other | Admitting: Neurology

## 2019-01-13 ENCOUNTER — Encounter

## 2019-01-13 VITALS — BP 142/99 | HR 81 | Temp 97.5°F | Wt 164.0 lb

## 2019-01-13 DIAGNOSIS — F419 Anxiety disorder, unspecified: Secondary | ICD-10-CM

## 2019-01-13 DIAGNOSIS — R4701 Aphasia: Secondary | ICD-10-CM | POA: Diagnosis not present

## 2019-01-13 DIAGNOSIS — G43109 Migraine with aura, not intractable, without status migrainosus: Secondary | ICD-10-CM | POA: Diagnosis not present

## 2019-01-13 DIAGNOSIS — R9082 White matter disease, unspecified: Secondary | ICD-10-CM

## 2019-01-13 MED ORDER — PROPRANOLOL HCL ER 60 MG PO CP24: 60.0000 mg | ORAL_CAPSULE | Freq: Every day | ORAL | 11 refills | Status: DC

## 2019-01-13 NOTE — Progress Notes (Signed)
[facility]  PATIENT: Cynthia Strickland DOB: [birth year]  REFERRING DOCTOR OR PCP:  Dr. [name] ([place]) and Dr. Rozetta Nunnery (PCP) SOURCE: Patient, notes from Dr. [name].   _________________________________   HISTORICAL  CHIEF COMPLAINT:  Chief Complaint  Patient presents with  . Follow-up    Chronic migraine room 12 pt alone    HISTORY OF PRESENT ILLNESS:  Cynthia Strickland is a 68 y.o. woman with chronci migraines.  Update 01/13/2019: She had a severe headache 12/05/2018.  She took Relpax and then had slurred speech.  She went to the [facility] and was transferred to [facility] for an MRI.  She had an MRI which showed no acute findings.   She had an elevated BP as well (194/112).   She received IV Benadryl, Compazine, Toradol, Decadron, Depacon (later in ED).    Her speech took about 5 hours to completely clear.    She notes she knew what she wanted to say but could not get the words out in phrases or sentences.     She gets occasional migraines that occur randomly but average just 1 / month.   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    She has a history of elevated BP when nervous but is typically 125-130/65-70 when she tests at home or when relaxed.   She does have a PFO on ECHO.         From Initial Consultation 08/30/2018: About 7 years ago I saw her at [facility] for classic migraine headaches.   As they were not frequent, she was just placed on Relpax.   A brain MRI was done.   She had some nonspecific foci on brain MRI and we did a bubble study ECHO that showed a PFO.   These typical migraines for her usually have a visual aura followed by pounding pain 15 minutes later.  She gets nausea and moving worsens the pain.  She usually has some photophobia and phonophobia.  Typical migraines continued to occur  rarely.    A month ago, she began to note short bursts of sharp pain in the right eye followed by a duller pain in the temple and forehead and occiput, all on the right.   These seem different than her typical migraine headaches.  Specifically, she does not have any photophobia or phonophobia and there is much milder nausea.   Pain is the same in the morning and night when present.    NSAID's help a little bit.   She saw Dr. [name] (Ophthalmology) who referred her to the office.   VA was 20/25 either eye.  Tonometry was 18 / 18.   PERRLA, VF full.  There is Mild OS conjunctivochalasis, mild cataracts.  Normal discs.      She was placed on an ointment for the mild blepharitis, advised to use artificial tears and referred for further evaluation.     She has OSA and is using CPAP.    REVIEW OF SYSTEMS: Constitutional: No fevers, chills, sweats, or change in appetite.  She has some insomnia. Eyes: No visual changes, double vision.  Eye pain as above. Ear, nose and throat: No hearing loss, ear pain, nasal congestion, sore throat Cardiovascular: No chest pain, palpitations Respiratory: No shortness of breath at rest or with exertion.   No wheezes GastrointestinaI: No nausea, vomiting, diarrhea,  abdominal pain, fecal incontinence Genitourinary: No dysuria, urinary retention or frequency.  No nocturia. Musculoskeletal: No neck pain, back pain Integumentary: No rash, pruritus, skin lesions Neurological: as above Psychiatric: She has had depression and anxiety, better on medications. Endocrine: No palpitations, diaphoresis, change in appetite, change in weigh or increased thirst Hematologic/Lymphatic: No anemia, purpura, petechiae. Allergic/Immunologic: No itchy/runny eyes, nasal congestion, recent allergic reactions, rashes  ALLERGIES: Allergies  Allergen Reactions  . Codeine Nausea Only and Nausea And Vomiting  . Sulfa Antibiotics Rash  . Sulfonamide Derivatives Rash    HOME MEDICATIONS:   Current Outpatient Medications:  .  DULoxetine (CYMBALTA) 30 MG capsule, Take 30 mg by mouth daily. , Disp: , Rfl:  .  fluticasone (FLONASE) 50 MCG/ACT nasal spray, 2 sprays by Each Nare route daily. Take consistently through allergy season for best effect., Disp: , Rfl:  .  meloxicam (MOBIC) 7.5 MG tablet, Take 1 tablet (7.5 mg total) by mouth daily. (Patient taking differently: Take 7.5 mg by mouth daily as needed for pain. ), Disp: 30 tablet, Rfl: 5 .  omeprazole (PRILOSEC) 40 MG capsule, TAKE ONE CAPSULE BY MOUTH TWICE A DAY (Patient taking differently: Take 40 mg by mouth daily. ), Disp: 60 capsule, Rfl: 3 .  Rimegepant Sulfate (NURTEC) 75 MG TBDP, Take 75 mg by mouth daily as needed. For migraines. Take as close to onset of migraine as possible. One daily maximum., Disp: 10 tablet, Rfl: 6 .  simvastatin (ZOCOR) 20 MG tablet, Take 20 mg by mouth daily at 6 PM. , Disp: , Rfl:  .  sucralfate (CARAFATE) 1 GM/10ML suspension, Take 10 mLs (1 g total) by mouth as needed., Disp: 420 mL, Rfl: 1 .  temazepam (RESTORIL) 15 MG capsule, TAKE 1 CAPSULE BY MOUTH EVERYDAY AT BEDTIME. MAX INS WILL COVER, Disp: , Rfl:  .  traMADol (ULTRAM) 50 MG tablet, every 6 (six) hours as needed. , Disp: , Rfl:  .  propranolol ER (INDERAL LA) 60 MG 24 hr capsule, Take 1 capsule (60 mg total) by mouth daily., Disp: 30 capsule, Rfl: 11  PAST MEDICAL HISTORY: Past Medical History:  Diagnosis Date  . Anxiety   . Barretts esophagus   . Cataract   . GERD (gastroesophageal reflux disease)   . Hiatal hernia   . Hyperlipidemia   . Hypertension    white coat syndrome  . Migraine headache with aura   . Mitral regurgitation 05-19-11    and Tricuspid Regurgitation(had Echo w/Bubble study)for migraines  . Rheumatoid arthritis ([place]) 12/2012    PAST SURGICAL HISTORY: Past Surgical History:  Procedure Laterality Date  . CESAREAN SECTION  1976  . COLONOSCOPY    . LAPAROSCOPIC OOPHORECTOMY  2005  . UPPER GASTROINTESTINAL  ENDOSCOPY    . VAGINAL HYSTERECTOMY  1995    FAMILY HISTORY: Family History  Problem Relation Age of Onset  . Endometrial cancer Mother   . Heart disease Father   . Heart disease Maternal Grandmother   . Stroke Maternal Grandmother   . Pancreatic cancer Maternal Grandfather   . Heart disease Paternal Grandfather   . Stroke Paternal Grandfather   . Colon cancer Neg Hx     SOCIAL HISTORY:  Social History   Socioeconomic History  . Marital status: Married    Spouse name: Not on file  . Number of children: Not on file  . Years of education: Not on file  . Highest education level: Not on file  Occupational History  . Occupation: retired - worked  in opthalmology office  Social Needs  . Financial resource strain: Not on file  . Food insecurity    Worry: Not on file    Inability: Not on file  . Transportation needs    Medical: Not on file    Non-medical: Not on file  Tobacco Use  . Smoking status: Former Smoker    Years: 13.00    Types: Cigarettes    Quit date: 03/31/1993    Years since quitting: 25.8  . Smokeless tobacco: Never Used  Substance and Sexual Activity  . Alcohol use: Yes    Alcohol/week: 2.0 standard drinks    Types: 2 Glasses of wine per week    Comment: Wine sometimes-weekends  . Drug use: No  . Sexual activity: Not on file  Lifestyle  . Physical activity    Days per week: Not on file    Minutes per session: Not on file  . Stress: Not on file  Relationships  . Social Herbalist on phone: Not on file    Gets together: Not on file    Attends religious service: Not on file    Active member of club or organization: Not on file    Attends meetings of clubs or organizations: Not on file    Relationship status: Not on file  . Intimate partner violence    Fear of current or ex partner: Not on file    Emotionally abused: Not on file    Physically abused: Not on file    Forced sexual activity: Not on file  Other Topics Concern  . Not on file   Social History Narrative   Right handed    Caffeine use: coffee very morning   No soda     PHYSICAL EXAM  Vitals:   01/13/19 1435  BP: (!) 142/99  Pulse: 81  Temp: (!) 97.5 F (36.4 C)  Weight: 164 lb (74.4 kg)    Body mass index is 32.03 kg/m.   General: The patient is well-developed and well-nourished and in no acute distress  Neck:  The neck is mildly tender at the occiput.  Skin: Extremities are without rash or edema.   Neurologic Exam  Mental status: The patient is alert and oriented x 3 at the time of the examination. The patient has apparent normal recent and remote memory, with an apparently normal attention span and concentration ability.   Speech is normal.  Cranial nerves: Extraocular movements are full. Trapezius and sternocleidomastoid strength is normal. No dysarthria is noted.  The tongue is midline, and the patient has symmetric elevation of the soft palate. No obvious hearing deficits are noted.  Motor:  Muscle bulk is normal.   Tone is normal. Strength is  5 / 5 in all 4 extremities.   Sensory: Sensory testing is intact to pinprick, soft touch and vibration sensation in all 4 extremities.  Coordination: Cerebellar testing reveals good finger-nose-finger and heel-to-shin bilaterally.  Gait and station: Station is normal.   Gait is normal.  Tandem gait is mildly wide Romberg is negative.   Reflexes: Deep tendon reflexes are symmetric and normal bilaterally.      DIAGNOSTIC DATA (LABS, IMAGING, TESTING) - I reviewed patient records, labs, notes, testing and imaging myself where available.  Lab Results  Component Value Date   WBC 7.3 12/05/2018   HGB 13.7 12/05/2018   HCT 42.1 12/05/2018   MCV 90.0 12/05/2018   PLT 245 12/05/2018      Component Value Date/Time  NA 140 12/05/2018 2243   K 3.4 (L) 12/05/2018 2243   CL 104 12/05/2018 2243   CO2 23 12/05/2018 2243   GLUCOSE 100 (H) 12/05/2018 2243   BUN 16 12/05/2018 2243   CREATININE 0.78  12/05/2018 2243   CALCIUM 9.2 12/05/2018 2243   PROT 7.1 12/05/2018 2243   ALBUMIN 4.6 12/05/2018 2243   AST 28 12/05/2018 2243   ALT 25 12/05/2018 2243   ALKPHOS 59 12/05/2018 2243   BILITOT 1.1 12/05/2018 2243   GFRNONAA >60 12/05/2018 2243   GFRAA >60 12/05/2018 2243   Lab Results  Component Value Date   CHOL 147 09/25/2011   HDL 40.70 09/25/2011   LDLCALC 89 09/25/2011   TRIG 87.0 09/25/2011   CHOLHDL 4 09/25/2011       ASSESSMENT AND PLAN  Complicated migraine  Aphasia  Anxiety  White matter abnormality on MRI of brain  1.   Try to get old MRI ([facility]) for comparison.    2.    Inderal LA 60 mg for migraine prophylaxis and white coat HTN.   She will track BP's. 3.   She has a PFO (though no lacunar or large vessel strokes) and I advised ASA 81 mg daily 4.   Nurtec instead of Relpax for acute treatment of migraine. 5.   RTC 6 months.  Richard A. Felecia Shelling, MD, [facility] [id number], [id number] PM Certified in Neurology, Clinical Neurophysiology, Sleep Medicine, Pain Medicine and Neuroimaging  [facility] [address] [place], [place] [address] [phone]

## 2019-01-17 ENCOUNTER — Telehealth: Payer: Self-pay | Admitting: Neurology

## 2019-01-17 NOTE — Telephone Encounter (Signed)
I called to let her know that I got her old MRI from 05/12/2011 and compared to the one from September 2020.  I had seen her last month for complicated migraine with aphasia.  I got her voicemail left a message  During the 7 years, there has been quite a bit of progression of the white matter change.  However, the changes are more consistent with microvascular disease rather than large vessel stroke.  She was found in the past to have a PFO on echocardiogram with bubble study in 2013.  As her changes were more consistent with small vessel rather than large vessel ischemic change, PFO closure was not recommended.  According to the medicine list, she is currently not on aspirin or Plavix.   I will recommend that she start aspirin 81 mg daily.

## 2019-01-24 NOTE — Telephone Encounter (Signed)
I spoke to Cynthia Strickland about her MRI and that it had shown quite a bit of progression compared to the one from 7 years ago with much more chronic microvascular ischemic changes.  After my last visit I had her start aspirin.  She also has hypertension and diabetes and she needs to make sure that the stay well controlled.  She has not smoked since her 33s.  We will consider getting another MRI in a year to reassess.

## 2019-03-14 ENCOUNTER — Telehealth: Payer: Self-pay | Admitting: Gastroenterology

## 2019-03-14 NOTE — Telephone Encounter (Signed)
If she is having diarrhea when we can do some stool studies but that does not seem to  be the case. If it is only odor and no diarrhea, this is often not something that is too concerning. Dietary change can be related or something she is ingesting, food or medications, not sure if anything new there. If she is concerned about this we can see her in the clinic to further assess. Thanks

## 2019-03-14 NOTE — Telephone Encounter (Signed)
Called patient back and gave her Dr. Doyne Keel thoughts on the foul BMs. I asked her to maybe keep a journal for a few days and see if she notices any foods she Is eating that might be causing the odor in her stool. If nothing and she wants to make an office visit, then please call us back

## 2019-03-14 NOTE — Telephone Encounter (Signed)
Called patient back and she states she has had soft BMs with a foul odor for 2-3 months. Said she had been reading up on it trying to figure out what was going on, and finally decided she better call. No n/v, no pain, no blood or mucus in the stool.

## 2019-03-31 ENCOUNTER — Other Ambulatory Visit: Payer: Self-pay | Admitting: Gastroenterology

## 2019-03-31 DIAGNOSIS — K2270 Barrett's esophagus without dysplasia: Secondary | ICD-10-CM

## 2019-06-16 ENCOUNTER — Encounter: Payer: Self-pay | Admitting: Gastroenterology

## 2019-07-12 ENCOUNTER — Ambulatory Visit (AMBULATORY_SURGERY_CENTER): Payer: Self-pay

## 2019-07-12 ENCOUNTER — Other Ambulatory Visit: Payer: Self-pay

## 2019-07-12 VITALS — Ht 59.5 in | Wt 166.2 lb

## 2019-07-12 DIAGNOSIS — Z1211 Encounter for screening for malignant neoplasm of colon: Secondary | ICD-10-CM

## 2019-07-12 NOTE — Progress Notes (Signed)
No egg or soy allergy known to patient  No issues with past sedation with any surgeries  or procedures, no intubation problems  No diet pills per patient No home 02 use per patient  No blood thinners per patient  Pt denies issues with constipation  No A fib or A flutter  EMMI video sent to pt's e mail   [place] 06/30/19  Due to the COVID-19 pandemic we are asking patients to follow these guidelines. Please only bring one care partner. Please be aware that your care partner may wait in the car in the parking lot or if they feel like they will be too hot to wait in the car, they may wait in the lobby on the 4th floor. All care partners are required to wear a mask the entire time (we do not have any that we can provide them), they need to practice social distancing, and we will do a Covid check for all patient's and care partners when you arrive. Also we will check their temperature and your temperature. If the care partner waits in their car they need to stay in the parking lot the entire time and we will call them on their cell phone when the patient is ready for discharge so they can bring the car to the front of the building. Also all patient's will need to wear a mask into building.

## 2019-07-14 ENCOUNTER — Ambulatory Visit (INDEPENDENT_AMBULATORY_CARE_PROVIDER_SITE_OTHER): Payer: Medicare Other | Admitting: Family Medicine

## 2019-07-14 ENCOUNTER — Other Ambulatory Visit: Payer: Self-pay

## 2019-07-14 ENCOUNTER — Encounter: Payer: Self-pay | Admitting: Family Medicine

## 2019-07-14 VITALS — BP 146/94 | HR 67 | Temp 97.0°F | Ht 60.0 in | Wt 166.0 lb

## 2019-07-14 DIAGNOSIS — G43109 Migraine with aura, not intractable, without status migrainosus: Secondary | ICD-10-CM | POA: Diagnosis not present

## 2019-07-14 NOTE — Progress Notes (Signed)
I have read the note, and I agree with the clinical assessment and plan.  Trenika Hudson A. Artis Buechele, MD, PhD, FAAN Certified in Neurology, Clinical Neurophysiology, Sleep Medicine, Pain Medicine and Neuroimaging  [facility] [address], [address] [place], [place] [address] [phone]  

## 2019-07-14 NOTE — Patient Instructions (Signed)
Continue current treatment plan  Stay well hydrated. Work on healthy lifestyle habits.   Follow up in 1 year, sooner if needed   Migraine Headache A migraine headache is a very strong throbbing pain on one side or both sides of your head. This type of headache can also cause other symptoms. It can last from 4 hours to 3 days. Talk with your doctor about what things may bring on (trigger) this condition. What are the causes? The exact cause of this condition is not known. This condition may be triggered or caused by:  Drinking alcohol.  Smoking.  Taking medicines, such as: ? Medicine used to treat chest pain (nitroglycerin). ? Birth control pills. ? Estrogen. ? Some blood pressure medicines.  Eating or drinking certain products.  Doing physical activity. Other things that may trigger a migraine headache include:  Having a menstrual period.  Pregnancy.  Hunger.  Stress.  Not getting enough sleep or getting too much sleep.  Weather changes.  Tiredness (fatigue). What increases the risk?  Being 69 years old.  Being female.  Having a family history of migraine headaches.  Being Caucasian.  Having depression or anxiety.  Being very overweight. What are the signs or symptoms?  A throbbing pain. This pain may: ? Happen in any area of the head, such as on one side or both sides. ? Make it hard to do daily activities. ? Get worse with physical activity. ? Get worse around bright lights or loud noises.  Other symptoms may include: ? Feeling sick to your stomach (nauseous). ? Vomiting. ? Dizziness. ? Being sensitive to bright lights, loud noises, or smells.  Before you get a migraine headache, you may get warning signs (an aura). An aura may include: ? Seeing flashing lights or having blind spots. ? Seeing bright spots, halos, or zigzag lines. ? Having tunnel vision or blurred vision. ? Having numbness or a tingling feeling. ? Having trouble  talking. ? Having weak muscles.  Some people have symptoms after a migraine headache (postdromal phase), such as: ? Tiredness. ? Trouble thinking (concentrating). How is this treated?  Taking medicines that: ? Relieve pain. ? Relieve the feeling of being sick to your stomach. ? Prevent migraine headaches.  Treatment may also include: ? Having acupuncture. ? Avoiding foods that bring on migraine headaches. ? Learning ways to control your body functions (biofeedback). ? Therapy to help you know and deal with negative thoughts (cognitive behavioral therapy). Follow these instructions at home: Medicines  Take over-the-counter and prescription medicines only as told by your doctor.  Ask your doctor if the medicine prescribed to you: ? Requires you to avoid driving or using heavy machinery. ? Can cause trouble pooping (constipation). You may need to take these steps to prevent or treat trouble pooping:  Drink enough fluid to keep your pee (urine) pale yellow.  Take over-the-counter or prescription medicines.  Eat foods that are high in fiber. These include beans, whole grains, and fresh fruits and vegetables.  Limit foods that are high in fat and sugar. These include fried or sweet foods. Lifestyle  Do not drink alcohol.  Do not use any products that contain nicotine or tobacco, such as cigarettes, e-cigarettes, and chewing tobacco. If you need help quitting, ask your doctor.  Get at least 8 hours of sleep every night.  Limit and deal with stress. General instructions      Keep a journal to find out what may bring on your migraine headaches. For example,  write down: ? What you eat and drink. ? How much sleep you get. ? Any change in what you eat or drink. ? Any change in your medicines.  If you have a migraine headache: ? Avoid things that make your symptoms worse, such as bright lights. ? It may help to lie down in a dark, quiet room. ? Do not drive or use heavy  machinery. ? Ask your doctor what activities are safe for you.  Keep all follow-up visits as told by your doctor. This is important. Contact a doctor if:  You get a migraine headache that is different or worse than others you have had.  You have more than 15 headache days in one month. Get help right away if:  Your migraine headache gets very bad.  Your migraine headache lasts longer than 72 hours.  You have a fever.  You have a stiff neck.  You have trouble seeing.  Your muscles feel weak or like you cannot control them.  You start to lose your balance a lot.  You start to have trouble walking.  You pass out (faint).  You have a seizure. Summary  A migraine headache is a very strong throbbing pain on one side or both sides of your head. These headaches can also cause other symptoms.  This condition may be treated with medicines and changes to your lifestyle.  Keep a journal to find out what may bring on your migraine headaches.  Contact a doctor if you get a migraine headache that is different or worse than others you have had.  Contact your doctor if you have more than 15 headache days in a month. This information is not intended to replace advice given to you by your health care provider. Make sure you discuss any questions you have with your health care provider. Document Revised: 07/09/2018 Document Reviewed: 04/29/2018 Elsevier Patient Education  [place].

## 2019-07-14 NOTE — Progress Notes (Signed)
PATIENT: Cynthia Strickland DOB: 12-16-1950  REASON FOR VISIT: follow up HISTORY FROM: patient  Chief Complaint  Patient presents with  . Follow-up    rm  6, alone , migraine, pt states she is doing great      HISTORY OF PRESENT ILLNESS: Today 07/14/19 Cynthia Strickland is a 69 y.o. female here today for follow up for migraines. She was started on proranolol 60mg  and Relpax switched to Nurtec at last visit in 12/2018. She has also continued meloxicam 7.5mg  daily, more for RA and usually prescribed by rhumatology. Creatinine normal 11/2018. She has not had any headaches since last visit. She had a visual aura late October and took a Nurtec. She never developed a migraine. BP has been well managed. Readings are elevated d/t white coat syndrome. Home readings are usually less than 140/80. She is seen regularly by PCP.   HISTORY: (copied from [name] note on 01/13/2019)  Cynthia Strickland is a 69 y.o. woman with chronci migraines.  Update 01/13/2019: She had a severe headache 12/05/2018.  She took Relpax and then had slurred speech.  She went to the [facility] and was transferred to [facility] for an MRI.  She had an MRI which showed no acute findings.   She had an elevated BP as well (194/112).   She received IV Benadryl, Compazine, Toradol, Decadron, Depacon (later in ED).    Her speech took about 5 hours to completely clear.    She notes she knew what she wanted to say but could not get the words out in phrases or sentences.     She gets occasional migraines that occur randomly but average just 1 / month.   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    She has a history of elevated BP when nervous but is typically 125-130/65-70 when she tests at home or when relaxed.   She does have a PFO on ECHO.        From Initial Consultation  08/30/2018: About 7 years ago I saw her at [facility] for classic migraine headaches.   As they were not frequent, she was just placed on Relpax.   A brain MRI was done.   She had some nonspecific foci on brain MRI and we did a bubble study ECHO that showed a PFO.   These typical migraines for her usually have a visual aura followed by pounding pain 15 minutes later.  She gets nausea and moving worsens the pain.  She usually has some photophobia and phonophobia.  Typical migraines continued to occur rarely.    A month ago, she began to note short bursts of sharp pain in the right eye followed by a duller pain in the temple and forehead and occiput, all on the right.   These seem different than her typical migraine headaches.  Specifically, she does not have any photophobia or phonophobia and there is much milder nausea.   Pain is the same in the morning and night when present.    NSAID's help a little bit.   She saw Dr. [name] (Ophthalmology) who referred her to the office.   VA was 20/25 either eye.  Tonometry was 18 / 18.   PERRLA, VF full.  There is Mild OS conjunctivochalasis, mild cataracts.  Normal discs.      She was placed  on an ointment for the mild blepharitis, advised to use artificial tears and referred for further evaluation.     She has OSA and is using CPAP.    REVIEW OF SYSTEMS: Out of a complete 14 system review of symptoms, the patient complains only of the following symptoms, headaches, chronic pain and all other reviewed systems are negative.   ALLERGIES: Allergies  Allergen Reactions  . Codeine Nausea Only and Nausea And Vomiting  . Humira [Adalimumab] Rash  . Sulfonamide Derivatives Rash    HOME MEDICATIONS: Outpatient Medications Prior to Visit  Medication Sig Dispense Refill  . aspirin EC 81 MG tablet Take 81 mg by mouth daily.    . DULoxetine (CYMBALTA) 30 MG capsule Take 30 mg by mouth daily.     . fluticasone (FLONASE) 50 MCG/ACT nasal spray 2 sprays  by Each Nare route daily. Take consistently through allergy season for best effect.    . meloxicam (MOBIC) 7.5 MG tablet Take 1 tablet (7.5 mg total) by mouth daily. (Patient taking differently: Take 7.5 mg by mouth daily as needed for pain. ) 30 tablet 5  . omeprazole (PRILOSEC) 40 MG capsule TAKE 1 CAPSULE BY MOUTH TWICE A DAY 180 capsule 1  . propranolol ER (INDERAL LA) 60 MG 24 hr capsule Take 1 capsule (60 mg total) by mouth daily. 30 capsule 11  . Rimegepant Sulfate (NURTEC) 75 MG TBDP Take 75 mg by mouth daily as needed. For migraines. Take as close to onset of migraine as possible. One daily maximum. 10 tablet 6  . simvastatin (ZOCOR) 20 MG tablet Take 20 mg by mouth daily at 6 PM.     . sucralfate (CARAFATE) 1 GM/10ML suspension Take 10 mLs (1 g total) by mouth as needed. 420 mL 1  . temazepam (RESTORIL) 15 MG capsule at bedtime as needed.     . traMADol (ULTRAM) 50 MG tablet every 6 (six) hours as needed.      No facility-administered medications prior to visit.    PAST MEDICAL HISTORY: Past Medical History:  Diagnosis Date  . Allergy    seasonal   . Anxiety   . Barretts esophagus   . Cataract   . GERD (gastroesophageal reflux disease)   . Hiatal hernia   . Hyperlipidemia   . Hypertension    white coat syndrome  . Migraine headache with aura   . Mitral regurgitation 05-19-11    and Tricuspid Regurgitation(had Echo w/Bubble study)for migraines  . Rheumatoid arthritis ([place]) 12/2012  . Sleep apnea    4/21-not using cpap, getting fitted for mouthguard    PAST SURGICAL HISTORY: Past Surgical History:  Procedure Laterality Date  . CESAREAN SECTION  1976  . COLONOSCOPY  2011  . LAPAROSCOPIC OOPHORECTOMY  2005  . UPPER GASTROINTESTINAL ENDOSCOPY    . VAGINAL HYSTERECTOMY  1995    FAMILY HISTORY: Family History  Problem Relation Age of Onset  . Endometrial cancer Mother   . Heart disease Father   . Heart disease Maternal Grandmother   . Stroke Maternal Grandmother     . Pancreatic cancer Maternal Grandfather   . Heart disease Paternal Grandfather   . Stroke Paternal Grandfather   . Colon polyps Sister   . Colon cancer Neg Hx   . Esophageal cancer Neg Hx   . Rectal cancer Neg Hx   . Stomach cancer Neg Hx     SOCIAL HISTORY: Social History   Socioeconomic History  . Marital status: Married    Spouse  name: Not on file  . Number of children: Not on file  . Years of education: Not on file  . Highest education level: Not on file  Occupational History  . Occupation: retired - worked in Public relations account executive  Tobacco Use  . Smoking status: Former Smoker    Years: 13.00    Types: Cigarettes    Quit date: 03/31/1993    Years since quitting: 26.3  . Smokeless tobacco: Never Used  Substance and Sexual Activity  . Alcohol use: Yes    Alcohol/week: 2.0 standard drinks    Types: 2 Glasses of wine per week    Comment: Wine sometimes-weekends  . Drug use: No  . Sexual activity: Not on file  Other Topics Concern  . Not on file  Social History Narrative   Right handed    Caffeine use: coffee very morning   No soda   Social Determinants of Health   Financial Resource Strain:   . Difficulty of Paying Living Expenses:   Food Insecurity:   . Worried About Charity fundraiser in the Last Year:   . Arboriculturist in the Last Year:   Transportation Needs:   . Film/video editor (Medical):   Marland Kitchen Lack of Transportation (Non-Medical):   Physical Activity:   . Days of Exercise per Week:   . Minutes of Exercise per Session:   Stress:   . Feeling of Stress :   Social Connections:   . Frequency of Communication with Friends and Family:   . Frequency of Social Gatherings with Friends and Family:   . Attends Religious Services:   . Active Member of Clubs or Organizations:   . Attends Archivist Meetings:   Marland Kitchen Marital Status:   Intimate Partner Violence:   . Fear of Current or Ex-Partner:   . Emotionally Abused:   Marland Kitchen Physically Abused:   .  Sexually Abused:       PHYSICAL EXAM  Vitals:   07/14/19 1251  BP: (!) 146/94  Pulse: 67  Temp: (!) 97 F (36.1 C)  Weight: 166 lb (75.3 kg)  Height: 5' (1.524 m)   Body mass index is 32.42 kg/m.  Generalized: Well developed, in no acute distress  Cardiology: normal rate and rhythm, no murmur noted Respiratory: clear to auscultation bilaterally   Neurological examination  Mentation: Alert oriented to time, place, history taking. Follows all commands speech and language fluent Cranial nerve II-XII: Pupils were equal round reactive to light. Extraocular movements were full, visual field were full  Motor: The motor testing reveals 5 over 5 strength of all 4 extremities. Good symmetric motor tone is noted throughout.  Sensory: Sensory testing is intact to soft touch on all 4 extremities. No evidence of extinction is noted.  Coordination: Cerebellar testing reveals good finger-nose-finger and heel-to-shin bilaterally.  Gait and station: Gait is normal.   DIAGNOSTIC DATA (LABS, IMAGING, TESTING) - I reviewed patient records, labs, notes, testing and imaging myself where available.  No flowsheet data found.   Lab Results  Component Value Date   WBC 7.3 12/05/2018   HGB 13.7 12/05/2018   HCT 42.1 12/05/2018   MCV 90.0 12/05/2018   PLT 245 12/05/2018      Component Value Date/Time   NA 140 12/05/2018 2243   K 3.4 (L) 12/05/2018 2243   CL 104 12/05/2018 2243   CO2 23 12/05/2018 2243   GLUCOSE 100 (H) 12/05/2018 2243   BUN 16 12/05/2018 2243   CREATININE  0.78 12/05/2018 2243   CALCIUM 9.2 12/05/2018 2243   PROT 7.1 12/05/2018 2243   ALBUMIN 4.6 12/05/2018 2243   AST 28 12/05/2018 2243   ALT 25 12/05/2018 2243   ALKPHOS 59 12/05/2018 2243   BILITOT 1.1 12/05/2018 2243   GFRNONAA >60 12/05/2018 2243   GFRAA >60 12/05/2018 2243   Lab Results  Component Value Date   CHOL 147 09/25/2011   HDL 40.70 09/25/2011   LDLCALC 89 09/25/2011   TRIG 87.0 09/25/2011   CHOLHDL  4 09/25/2011   No results found for: HGBA1C No results found for: VITAMINB12 No results found for: TSH     ASSESSMENT AND PLAN 69 y.o. year old female  has a past medical history of Allergy, Anxiety, Barretts esophagus, Cataract, GERD (gastroesophageal reflux disease), Hiatal hernia, Hyperlipidemia, Hypertension, Migraine headache with aura, Mitral regurgitation (05-19-11), Rheumatoid arthritis ([place]) (12/2012), and Sleep apnea. here with     ICD-10-CM   1. Complicated migraine  123XX123     She is doing very well. We will continue propranolol ER 60mg  daily and Nurtec as needed. No migraines since starting these medications. She may continue meloxicam. Creatinine normal. Advised of potential side effects with chronic Nsaid use. She will work on healthy lifestyle changes. Adequate hydration, well balanced diet and regular exercise advised. She will reach out to healthy weight and wellness for weight management. She will follow up with [place] in 1 year, sooner if needed. She verbalizes understanding and agreement with this plan.    No orders of the defined types were placed in this encounter.    No orders of the defined types were placed in this encounter.     I spent 15 minutes with the patient. 50% of this time was spent counseling and educating patient on plan of care and medications.    Debbora Presto, FNP-C 07/14/2019, 12:57 PM [facility] [address] [place], [place] [address] [phone]

## 2019-07-25 ENCOUNTER — Other Ambulatory Visit: Payer: Self-pay

## 2019-07-25 ENCOUNTER — Ambulatory Visit (AMBULATORY_SURGERY_CENTER): Payer: Medicare Other | Admitting: Gastroenterology

## 2019-07-25 ENCOUNTER — Encounter: Payer: Self-pay | Admitting: Gastroenterology

## 2019-07-25 VITALS — BP 164/92 | HR 55 | Temp 96.2°F | Resp 25 | Ht 59.5 in | Wt 166.2 lb

## 2019-07-25 DIAGNOSIS — Z1211 Encounter for screening for malignant neoplasm of colon: Secondary | ICD-10-CM | POA: Diagnosis not present

## 2019-07-25 MED ORDER — SODIUM CHLORIDE 0.9 % IV SOLN: 500.0000 mL | Freq: Once | INTRAVENOUS | Status: DC

## 2019-07-25 NOTE — Op Note (Signed)
[facility] Patient Name: Cynthia Strickland Procedure Date: 07/25/2019 1:41 PM MRN: [id number] Endoscopist: Cynthia Strickland , MD Age: 69 Referring MD:  Date of Birth: [birth year] Gender: Female Account #: [id number] Procedure:                Colonoscopy Indications:              Screening for colorectal malignant neoplasm Medicines:                Monitored Anesthesia Care Procedure:                Pre-Anesthesia Assessment:                           - Prior to the procedure, a History and Physical                            was performed, and patient medications and                            allergies were reviewed. The patient's tolerance of                            previous anesthesia was also reviewed. The risks                            and benefits of the procedure and the sedation                            options and risks were discussed with the patient.                            All questions were answered, and informed consent                            was obtained. Prior Anticoagulants: The patient has                            taken no previous anticoagulant or antiplatelet                            agents. ASA Grade Assessment: II - A patient with                            mild systemic disease. After reviewing the risks                            and benefits, the patient was deemed in                            satisfactory condition to undergo the procedure.                           After obtaining informed consent, the colonoscope  was passed under direct vision. Throughout the                            procedure, the patient's blood pressure, pulse, and                            oxygen saturations were monitored continuously. The                            Colonoscope was introduced through the anus and                            advanced to the the cecum, identified by                            appendiceal orifice  and ileocecal valve. The                            colonoscopy was performed without difficulty. The                            patient tolerated the procedure well. The quality                            of the bowel preparation was adequate. The                            ileocecal valve, appendiceal orifice, and rectum                            were photographed. Scope In: 1:49:59 PM Scope Out: 2:11:27 PM Scope Withdrawal Time: 0 hours 16 minutes 53 seconds  Total Procedure Duration: 0 hours 21 minutes 28 seconds  Findings:                 The perianal and digital rectal examinations were                            normal.                           A few medium-mouthed diverticula were found in the                            left colon.                           Internal hemorrhoids were found during                            retroflexion. The hemorrhoids were moderate.                           The exam was otherwise without abnormality. No  polyps. Complications:            No immediate complications. Estimated blood loss:                            None. Estimated Blood Loss:     Estimated blood loss: none. Impression:               - Diverticulosis in the left colon.                           - Internal hemorrhoids.                           - The examination was otherwise normal.                           - No polyps Recommendation:           - Patient has a contact number available for                            emergencies. The signs and symptoms of potential                            delayed complications were discussed with the                            patient. Return to normal activities tomorrow.                            Written discharge instructions were provided to the                            patient.                           - Resume previous diet.                           - Continue present medications.                            - I don't feel any further colonoscopy screening is                            warranted given this normal exam does not warrant                            screening for another 10 years, at which time the                            patient will be 69 years old. Cynthia Lipps P. Cynthia Gashi, MD 07/25/2019 2:16:48 PM This report has been signed electronically.

## 2019-07-25 NOTE — Progress Notes (Signed)
A/ox3, pleased with MAC, report to RN 

## 2019-07-25 NOTE — Progress Notes (Signed)
Pt's states no medical or surgical changes since previsit or office visit.  Pt walked in with a Dumb Dumb lollipop.  She said she had it because she was nauseated but had not licked her lolliepop.

## 2019-07-25 NOTE — Patient Instructions (Addendum)
Normal colonoscopy  YOU HAD AN ENDOSCOPIC PROCEDURE TODAY AT [facility]:   Refer to the procedure report that was given to you for any specific questions about what was found during the examination.  If the procedure report does not answer your questions, please call your gastroenterologist to clarify.  If you requested that your care partner not be given the details of your procedure findings, then the procedure report has been included in a sealed envelope for you to review at your convenience later.  YOU SHOULD EXPECT: Some feelings of bloating in the abdomen. Passage of more gas than usual.  Walking can help get rid of the air that was put into your GI tract during the procedure and reduce the bloating. If you had a lower endoscopy (such as a colonoscopy or flexible sigmoidoscopy) you may notice spotting of blood in your stool or on the toilet paper. If you underwent a bowel prep for your procedure, you may not have a normal bowel movement for a few days.  Please Note:  You might notice some irritation and congestion in your nose or some drainage.  This is from the oxygen used during your procedure.  There is no need for concern and it should clear up in a day or so.  SYMPTOMS TO REPORT IMMEDIATELY:   Following lower endoscopy (colonoscopy or flexible sigmoidoscopy):  Excessive amounts of blood in the stool  Significant tenderness or worsening of abdominal pains  Swelling of the abdomen that is new, acute  Fever of 100F or higher   For urgent or emergent issues, a gastroenterologist can be reached at any hour by calling [phone]. Do not use MyChart messaging for urgent concerns.    DIET:  We do recommend a small meal at first, but then you may proceed to your regular diet.  Drink plenty of fluids but you should avoid alcoholic beverages for 24 hours.  ACTIVITY:  You should plan to take it easy for the rest of today and you should NOT DRIVE or use heavy machinery  until tomorrow (because of the sedation medicines used during the test).    FOLLOW UP: Our staff will call the number listed on your records 48-72 hours following your procedure to check on you and address any questions or concerns that you may have regarding the information given to you following your procedure. If we do not reach you, we will leave a message.  We will attempt to reach you two times.  During this call, we will ask if you have developed any symptoms of COVID 19. If you develop any symptoms (ie: fever, flu-like symptoms, shortness of breath, cough etc.) before then, please call [phone].  If you test positive for Covid 19 in the 2 weeks post procedure, please call and report this information to [place].    If any biopsies were taken you will be contacted by phone or by letter within the next 1-3 weeks.  Please call us at [phone] if you have not heard about the biopsies in 3 weeks.    SIGNATURES/CONFIDENTIALITY: You and/or your care partner have signed paperwork which will be entered into your electronic medical record.  These signatures attest to the fact that that the information above on your After Visit Summary has been reviewed and is understood.  Full responsibility of the confidentiality of this discharge information lies with you and/or your care-partner.

## 2019-07-27 ENCOUNTER — Telehealth: Payer: Self-pay

## 2019-07-27 NOTE — Telephone Encounter (Signed)
Called [phone] and left a messaged we tried to reach pt for a follow up call. maw

## 2019-07-27 NOTE — Telephone Encounter (Signed)
No answer, left message to call back later today, B.Orris Perin RN. 

## 2019-09-14 ENCOUNTER — Encounter: Payer: Self-pay | Admitting: Family Medicine

## 2019-09-21 ENCOUNTER — Other Ambulatory Visit: Payer: Self-pay | Admitting: Neurology

## 2019-11-17 ENCOUNTER — Other Ambulatory Visit: Payer: Self-pay | Admitting: Internal Medicine

## 2019-11-17 DIAGNOSIS — Z1231 Encounter for screening mammogram for malignant neoplasm of breast: Secondary | ICD-10-CM

## 2019-11-23 ENCOUNTER — Other Ambulatory Visit: Payer: Self-pay | Admitting: Obstetrics & Gynecology

## 2019-11-23 DIAGNOSIS — R928 Other abnormal and inconclusive findings on diagnostic imaging of breast: Secondary | ICD-10-CM

## 2019-12-07 ENCOUNTER — Ambulatory Visit
Admission: RE | Admit: 2019-12-07 | Discharge: 2019-12-07 | Disposition: A | Discharge status: Home / Self Care | Payer: Medicare Other | Source: Ambulatory Visit | Attending: Obstetrics & Gynecology | Admitting: Obstetrics & Gynecology

## 2019-12-07 ENCOUNTER — Other Ambulatory Visit: Payer: Self-pay

## 2019-12-07 ENCOUNTER — Ambulatory Visit: Payer: Medicare Other

## 2019-12-07 DIAGNOSIS — R928 Other abnormal and inconclusive findings on diagnostic imaging of breast: Secondary | ICD-10-CM

## 2019-12-15 ENCOUNTER — Other Ambulatory Visit: Payer: Self-pay | Admitting: Gastroenterology

## 2019-12-15 DIAGNOSIS — K2270 Barrett's esophagus without dysplasia: Secondary | ICD-10-CM

## 2020-01-19 ENCOUNTER — Other Ambulatory Visit: Payer: Self-pay | Admitting: Neurology

## 2020-04-01 ENCOUNTER — Other Ambulatory Visit: Payer: Self-pay | Admitting: Neurology

## 2020-04-10 ENCOUNTER — Ambulatory Visit: Payer: Medicare Other | Attending: Internal Medicine

## 2020-04-10 ENCOUNTER — Other Ambulatory Visit (HOSPITAL_BASED_OUTPATIENT_CLINIC_OR_DEPARTMENT_OTHER): Payer: Self-pay | Admitting: Internal Medicine

## 2020-04-10 DIAGNOSIS — Z23 Encounter for immunization: Secondary | ICD-10-CM

## 2020-04-10 MED FILL — MODERNA COVID-19 VACCINE 10: 100 | 28 days supply | Qty: 0 | Fill #0

## 2020-04-10 NOTE — Progress Notes (Signed)
   Covid-19 Vaccination Clinic  Name:  AIREAL SLATER    MRN: [id number] DOB: 1950/10/10  04/10/2020  Ms. Frater was observed post Covid-19 immunization for 15 minutes without incident. She was provided with Vaccine Information Sheet and instruction to access the V-Safe system.   Ms. Sloan was instructed to call 911 with any severe reactions post vaccine: Marland Kitchen Difficulty breathing  . Swelling of face and throat  . A fast heartbeat  . A bad rash all over body  . Dizziness and weakness   Immunizations Administered    Name Date Dose VIS Date Route   Moderna Covid-19 Booster Vaccine 04/10/2020 10:24 AM 0.25 mL 01/18/2020 Intramuscular   Manufacturer: Levan Hurst   Lot: [id number]   [place]: [id number]

## 2020-05-02 ENCOUNTER — Other Ambulatory Visit: Payer: Self-pay

## 2020-05-02 DIAGNOSIS — K2270 Barrett's esophagus without dysplasia: Secondary | ICD-10-CM

## 2020-05-02 MED ORDER — OMEPRAZOLE 40 MG PO CPDR: 40.0000 mg | DELAYED_RELEASE_CAPSULE | Freq: Two times a day (BID) | ORAL | 0 refills | Status: DC

## 2020-05-02 NOTE — Progress Notes (Signed)
Rec'd fax request for refill of omeprazole 40 mg.  Sent to [facility]. Patient had procedure in 06-2019

## 2020-05-03 ENCOUNTER — Telehealth: Payer: Self-pay | Admitting: Gastroenterology

## 2020-05-03 DIAGNOSIS — K2270 Barrett's esophagus without dysplasia: Secondary | ICD-10-CM

## 2020-05-04 MED ORDER — OMEPRAZOLE 40 MG PO CPDR: 40.0000 mg | DELAYED_RELEASE_CAPSULE | Freq: Two times a day (BID) | ORAL | 0 refills | Status: DC

## 2020-05-04 NOTE — Telephone Encounter (Signed)
Refill sent.

## 2020-06-13 ENCOUNTER — Ambulatory Visit (INDEPENDENT_AMBULATORY_CARE_PROVIDER_SITE_OTHER): Payer: Medicare Other | Admitting: Gastroenterology

## 2020-06-13 ENCOUNTER — Encounter: Payer: Self-pay | Admitting: Gastroenterology

## 2020-06-13 VITALS — BP 114/84 | HR 62 | Ht 59.5 in | Wt 162.2 lb

## 2020-06-13 DIAGNOSIS — K449 Diaphragmatic hernia without obstruction or gangrene: Secondary | ICD-10-CM | POA: Diagnosis not present

## 2020-06-13 DIAGNOSIS — Z79899 Other long term (current) drug therapy: Secondary | ICD-10-CM

## 2020-06-13 DIAGNOSIS — K219 Gastro-esophageal reflux disease without esophagitis: Secondary | ICD-10-CM

## 2020-06-13 MED ORDER — OMEPRAZOLE 40 MG PO CPDR: DELAYED_RELEASE_CAPSULE | ORAL | 3 refills | Status: DC

## 2020-06-13 NOTE — Patient Instructions (Signed)
If you are age 68 or older, your body mass index should be between 23-30. Your Body mass index is 32.22 kg/m. If this is out of the aforementioned range listed, please consider follow up with your Primary Care Provider.  If you are age 82 or younger, your body mass index should be between 19-25. Your Body mass index is 32.22 kg/m. If this is out of the aformentioned range listed, please consider follow up with your Primary Care Provider.    We have sent the following medications to your pharmacy for you to pick up at your convenience: Omeprazole 20 mg: Take once to twice daily  Thank you for entrusting me with your care and for choosing [employer], Dr. [place] Cellar

## 2020-06-13 NOTE — Progress Notes (Signed)
HPI :  70 year old follow-up visit for history of GERD and reported remote history of Barrett's.  I last saw her about a year ago for screening colonoscopy which was normal without any polyps.  She historically has had longstanding reflux symptoms.  She had multiple EGDs with Dr. Olevia Perches as outlined below, she had an irregular Z-line with biopsies showing Barrett's esophagus in 2011, 2013, 2015.  She had a follow-up EGD with me in April 2018 which showed an irregular Z-line with a slight extension of salmon-colored mucosa less than 1 cm length.  Biopsies did not show Barrett's esophagus or dysplasia.  She has continued with omeprazole once daily since have seen her.  She states this generally controls her symptoms pretty well and she does not have any significant breakthrough on the regimen.  Denies any dysphagia.  She inquires about long-term risks of chronic PPI use and ways to manage this.  She does have a history of osteopenia but no history of fractures.  No history of kidney disease.  No family history of esophageal cancer. She smoked cigarettes historically, stopped in her 16s.. Mother had endometrial cancer.   Endoscopic history  Colonoscopy 07/25/19 - - The perianal and digital rectal examinations were normal. - A few medium-mouthed diverticula were found in the left colon. - Internal hemorrhoids were found during retroflexion. The hemorrhoids were moderate. - The exam was otherwise without abnormality. No polyps.  EGD 07/08/2016 -  - A 3 cm hiatal hernia was present. - The Z-line was irregular with a small extension of a tongue of salmon colored mucosa, although < 1cm in length and without nodularity. Given the patient's reported history of Barrett's esophagus, biopsies were taken with a cold forceps for histology. - The exam of the esophagus was otherwise normal. No esophagitis. - The entire examined stomach was normal. 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 Biopsies were taken with a cold forceps for histology to ensure no adenomatous change. - The exam of the duodenum was otherwise normal.  1. Surgical [P], duodenal bulb - FINDINGS CONSISTENT WITH GASTRIC HETEROTOPIA. - NO DYSPLASIA OR MALIGNANCY IDENTIFIED. 2. Surgical [P], GE junction - BENIGN REACTIVE SQUAMOUS MUCOSA. - BENIGN CARDIAC-TYPE GASTRIC MUCOSA WITH ASSOCIATED CHRONIC INFLAMMATION. - NO INTESTINAL METAPLASIA, DYSPLASIA, OR MALIGNANCY. - SEE COMMENT.  EGD 08/05/2013 - 3cm hiatal hernia, nonobstructing Shatski ring, irregular z-line - biopsies c/w Barrett's EGD 06/13/2011 - irregular z-line - biopsies c/w Barrett's esophagus EGD 06/06/2009 - esophagitis, small hiatal hernia, gastritis - biopses c/w Barrett's Colonoscopy 06/06/2009 - diverticulosis, hemorrhoids Colonoscopy 07/28/2002 - normal colonoscopy    Past Medical History:  Diagnosis Date  . Allergy    seasonal   . Anxiety   . Barretts esophagus   . Cataract   . GERD (gastroesophageal reflux disease)   . Hiatal hernia   . Hyperlipidemia   . Hypertension    white coat syndrome  . Migraine headache with aura   . Mitral regurgitation 05-19-11    and Tricuspid Regurgitation(had Echo w/Bubble study)for migraines  . Rheumatoid arthritis ([place]) 12/2012  . Sleep apnea    4/21-not using cpap, getting fitted for mouthguard     Past Surgical History:  Procedure Laterality Date  . CESAREAN SECTION  1976  . COLONOSCOPY  2011  . LAPAROSCOPIC OOPHORECTOMY  2005  . UPPER GASTROINTESTINAL ENDOSCOPY    . VAGINAL HYSTERECTOMY  1995   Family History  Problem Relation Age of Onset  .  Endometrial cancer Mother   . Heart disease Father   . Heart disease Maternal Grandmother   . Stroke Maternal Grandmother   . Pancreatic cancer Maternal Grandfather   . Heart disease Paternal Grandfather   . Stroke Paternal Grandfather   . Colon polyps Sister   . Colon cancer Neg Hx   .  Esophageal cancer Neg Hx   . Rectal cancer Neg Hx   . Stomach cancer Neg Hx    Social History   Tobacco Use  . Smoking status: Former Smoker    Years: 13.00    Types: Cigarettes    Quit date: 03/31/1993    Years since quitting: 27.2  . Smokeless tobacco: Never Used  Vaping Use  . Vaping Use: Never used  Substance Use Topics  . Alcohol use: Yes    Alcohol/week: 2.0 standard drinks    Types: 2 Glasses of wine per week    Comment: Wine sometimes-weekends  . Drug use: No   Current Outpatient Medications  Medication Sig Dispense Refill  . aspirin EC 81 MG tablet Take 81 mg by mouth daily.    . DULoxetine (CYMBALTA) 30 MG capsule Take 30 mg by mouth daily.     . meloxicam (MOBIC) 7.5 MG tablet Take 1 tablet (7.5 mg total) by mouth daily. (Patient taking differently: Take 7.5 mg by mouth daily as needed for pain.) 30 tablet 5  . omeprazole (PRILOSEC) 40 MG capsule Take 40 mg by mouth daily.    . promethazine (PHENERGAN) 12.5 MG tablet TAKE 1 TABLET (12.5 MG TOTAL) BY MOUTH EVERY 6 (SIX) HOURS AS NEEDED FOR NAUSEA OR VOMITING. 30 tablet 1  . propranolol ER (INDERAL LA) 60 MG 24 hr capsule TAKE 1 CAPSULE BY MOUTH EVERY DAY 90 capsule 3  . Rimegepant Sulfate (NURTEC) 75 MG TBDP Take 75 mg by mouth daily as needed. For migraines. Take as close to onset of migraine as possible. One daily maximum. 10 tablet 6  . simvastatin (ZOCOR) 20 MG tablet Take 20 mg by mouth daily at 6 PM.     . temazepam (RESTORIL) 15 MG capsule at bedtime as needed.     . traMADol (ULTRAM) 50 MG tablet every 6 (six) hours as needed.      Current Facility-Administered Medications  Medication Dose Route Frequency Provider Last Rate Last Admin  . 0.9 %  sodium chloride infusion  500 mL Intravenous Once Shenea Giacobbe, Carlota Raspberry, MD       Allergies  Allergen Reactions  . Codeine Nausea Only and Nausea And Vomiting  . Humira [Adalimumab] Rash  . Sulfonamide Derivatives Rash     Review of Systems: All systems reviewed  and negative except where noted in HPI.   Lab Results  Component Value Date   WBC 7.3 12/05/2018   HGB 13.7 12/05/2018   HCT 42.1 12/05/2018   MCV 90.0 12/05/2018   PLT 245 12/05/2018    Lab Results  Component Value Date   CREATININE 0.78 12/05/2018   BUN 16 12/05/2018   NA 140 12/05/2018   K 3.4 (L) 12/05/2018   CL 104 12/05/2018   CO2 23 12/05/2018    Lab Results  Component Value Date   ALT 25 12/05/2018   AST 28 12/05/2018   ALKPHOS 59 12/05/2018   BILITOT 1.1 12/05/2018     Physical Exam: BP 114/84 (BP Location: Left Arm, Patient Position: Sitting, Cuff Size: Normal)   Pulse 62   Ht 4' 11.5" (1.511 m)   Wt 162  lb 4 oz (73.6 kg)   BMI 32.22 kg/m  Constitutional: Pleasant,well-developed, female in no acute distress. Neurological: Alert and oriented to person place and time. Psychiatric: Normal mood and affect. Behavior is normal.   ASSESSMENT AND PLAN: 70 year old female here for reassessment of the following:  GERD Hiatal hernia Long-term use of proton pump inhibitor therapy  As above, longstanding history of GERD with irregular Z-line for which biopsies were consistent with Barrett's on 3 endoscopies in prior years, however in 2018 biopsies negative for Barrett's and the segment in question is less than 1 cm in length.  I told her that this appears to be a very low risk lesion, even if she does have Barrett's it is less than 1 cm in length and will probably never cause her a problem.  She is a bit anxious about this and her long-term risk for malignancy.  Based on biopsies of her most recent exam, we would normally not recommend surveillance, however given her history of prior Barrett's she may wish to have another EGD next year, 5 years from her last exam, for surveillance purposes, if that would provide her peace of mind.  She will see me at that time to discuss if she wants to do this or not.  Otherwise we discussed the long-term use of proton pump inhibitor  therapy, and associated risks to include kidney disease, increased risk for fracture, vitamin deficiency, increased risk for gastric cancer, etc.  She does have a history of osteopenia, I do want her to use the lowest dose of medication needed to control her symptoms.  We will try to decrease her dose to 20 mg a day and see how she does with that.  If she has significant breakthrough reflux or symptoms of bother her routinely despite that regimen, she can increase back to 40 mg a day.  She agreed with the plan.  We did briefly discussed that fixing the hiatal hernia would require surgery and she is not interested in that which I agree with.  She is currently stable on her current regimen.  She will follow-up with me in 1 year for reassessment.  Atl[place] Cellar, MD [facility]

## 2020-07-16 ENCOUNTER — Ambulatory Visit: Payer: Medicare Other | Admitting: Family Medicine

## 2020-07-23 ENCOUNTER — Telehealth: Payer: Self-pay | Admitting: Family Medicine

## 2020-07-23 ENCOUNTER — Ambulatory Visit: Payer: Medicare Other | Admitting: Family Medicine

## 2020-07-23 NOTE — Telephone Encounter (Signed)
LVM for pt to let her know her appt today needs to be cancelled and rescheduled due to Amy leaving early. Asked pt to call back, also sent a MyChart message.

## 2020-11-06 ENCOUNTER — Ambulatory Visit: Payer: Medicare Other | Admitting: Family Medicine

## 2020-11-26 NOTE — Progress Notes (Addendum)
PATIENT: Cynthia Strickland DOB: Nov 08, 1950  REASON FOR VISIT: follow up HISTORY FROM: patient  Chief Complaint  Patient presents with   Follow-up    New room, alone. Only has had 2 migraines this year.      HISTORY OF PRESENT ILLNESS: 11/27/20 ALL: Cynthia Strickland returns for follow up for migraines. She continues propranolol ER '60mg'$  daily and Nurtec as needed. She feels headaches are very well managed. She has only had two migraines in the past year. Nurtec works well for abortive therapy. She lost her son July 2021. He was her only child and had suffered from depression and alcoholism. She does feel that she has a great support system with her husband and church community.   07/14/2019 ALL:  Cynthia Strickland is a 70 y.o. female here today for follow up for migraines. She was started on proranolol '60mg'$  and Relpax switched to Nurtec at last visit in 12/2018. She has also continued meloxicam 7.'5mg'$  daily, more for RA and usually prescribed by rhumatology. Creatinine normal 11/2018. She has not had any headaches since last visit. She had a visual aura late October and took a Nurtec. She never developed a migraine. BP has been well managed. Readings are elevated d/t white coat syndrome. Home readings are usually less than 140/80. She is seen regularly by PCP.   HISTORY: (copied from [name] note on 01/13/2019)  Cynthia Strickland is a 70 y.o. woman with chronci migraines.   Update 01/13/2019: She had a severe headache 12/05/2018.  She took Relpax and then had slurred speech.  [facility] to [facility] and was transferred to [facility] for an MRI.  She had an MRI which showed no acute findings.   She had an elevated BP as well (194/112).   She received IV Benadryl, Compazine, Toradol, Decadron, Depacon (later in ED).    Her speech took about 5 hours to completely clear.    She notes she knew what she wanted to say but could not get the words out in phrases or sentences.      She gets  occasional migraines that occur randomly but average just 1 / month.   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     She has a history of elevated BP when nervous but is typically 125-130/65-70 when she tests at home or when relaxed.   She does have a PFO on ECHO.        From Initial Consultation 08/30/2018: About 7 years ago I saw her at [facility] for classic migraine headaches.   As they were not frequent, she was just placed on Relpax.   A brain MRI was done.   She had some nonspecific foci on brain MRI and we did a bubble study ECHO that showed a PFO.   These typical migraines for her usually have a visual aura followed by pounding pain 15 minutes later.  She gets nausea and moving worsens the pain.  She usually has some photophobia and phonophobia.  Typical migraines continued to occur rarely.     A month ago, she began to note short bursts of sharp pain in the right eye followed by a duller pain in the temple and forehead and occiput, all on the right.   These seem different than her typical migraine headaches.  Specifically, she does not  have any photophobia or phonophobia and there is much milder nausea.   Pain is the same in the morning and night when present.    NSAID's help a little bit.    She saw Dr. [name] (Ophthalmology) who referred her to the office.   VA was 20/25 either eye.  Tonometry was 18 / 18.   PERRLA, VF full.  There is Mild OS conjunctivochalasis, mild cataracts.  Normal discs.      She was placed on an ointment for the mild blepharitis, advised to use artificial tears and referred for further evaluation.      She has OSA and is using CPAP.    REVIEW OF SYSTEMS: Out of a complete 14 system review of symptoms, the patient complains only of the following symptoms, headaches, chronic pain and all other reviewed systems are  negative.   ALLERGIES: Allergies  Allergen Reactions   Codeine Nausea Only and Nausea And Vomiting   Humira [Adalimumab] Rash   Sulfonamide Derivatives Rash    HOME MEDICATIONS: Outpatient Medications Prior to Visit  Medication Sig Dispense Refill   aspirin EC 81 MG tablet Take 81 mg by mouth daily.     COVID-19 mRNA vaccine, Moderna, 100 MCG/0.5ML injection INJECT AS DIRECTED .25 mL 0   DULoxetine (CYMBALTA) 60 MG capsule Take 60 mg by mouth daily.     meloxicam (MOBIC) 7.5 MG tablet Take 1 tablet (7.5 mg total) by mouth daily. (Patient taking differently: Take 7.5 mg by mouth daily as needed for pain.) 30 tablet 5   omeprazole (PRILOSEC) 40 MG capsule Take 1 tablet by mouth once to twice daily 180 capsule 3   promethazine (PHENERGAN) 12.5 MG tablet TAKE 1 TABLET (12.5 MG TOTAL) BY MOUTH EVERY 6 (SIX) HOURS AS NEEDED FOR NAUSEA OR VOMITING. 30 tablet 1   simvastatin (ZOCOR) 20 MG tablet Take 20 mg by mouth daily at 6 PM.      traMADol (ULTRAM) 50 MG tablet every 6 (six) hours as needed.      traZODone (DESYREL) 50 MG tablet Take 50 mg by mouth at bedtime.     propranolol ER (INDERAL LA) 60 MG 24 hr capsule TAKE 1 CAPSULE BY MOUTH EVERY DAY 90 capsule 3   Rimegepant Sulfate (NURTEC) 75 MG TBDP Take 75 mg by mouth daily as needed. For migraines. Take as close to onset of migraine as possible. One daily maximum. 10 tablet 6   DULoxetine (CYMBALTA) 30 MG capsule Take 30 mg by mouth daily.      temazepam (RESTORIL) 15 MG capsule at bedtime as needed.      Facility-Administered Medications Prior to Visit  Medication Dose Route Frequency Provider Last Rate Last Admin   0.9 %  sodium chloride infusion  500 mL Intravenous Once Armbruster, Carlota Raspberry, MD        PAST MEDICAL HISTORY: Past Medical History:  Diagnosis Date   Allergy    seasonal    Anxiety    Barretts esophagus    Cataract    GERD (gastroesophageal reflux disease)    Hiatal hernia    Hyperlipidemia    Hypertension     white coat syndrome   Migraine headache with aura    Mitral regurgitation 05-19-11    and Tricuspid Regurgitation(had Echo w/Bubble study)for migraines   Rheumatoid arthritis ([place]) 12/2012   Sleep apnea    4/21-not using cpap, getting fitted for mouthguard    PAST SURGICAL HISTORY: Past Surgical History:  Procedure Laterality Date  CESAREAN SECTION  1976   COLONOSCOPY  2011   LAPAROSCOPIC OOPHORECTOMY  2005   UPPER GASTROINTESTINAL ENDOSCOPY     VAGINAL HYSTERECTOMY  1995    FAMILY HISTORY: Family History  Problem Relation Age of Onset   Endometrial cancer Mother    Heart disease Father    Heart disease Maternal Grandmother    Stroke Maternal Grandmother    Pancreatic cancer Maternal Grandfather    Heart disease Paternal Grandfather    Stroke Paternal Grandfather    Colon polyps Sister    Colon cancer Neg Hx    Esophageal cancer Neg Hx    Rectal cancer Neg Hx    Stomach cancer Neg Hx     SOCIAL HISTORY: Social History   Socioeconomic History   Marital status: Married    Spouse name: Not on file   Number of children: Not on file   Years of education: Not on file   Highest education level: Not on file  Occupational History   Occupation: retired - worked in opthalmology office  Tobacco Use   Smoking status: Former    Years: 13.00    Types: Cigarettes    Quit date: 03/31/1993    Years since quitting: 27.6   Smokeless tobacco: Never  Vaping Use   Vaping Use: Never used  Substance and Sexual Activity   Alcohol use: Yes    Alcohol/week: 2.0 standard drinks    Types: 2 Glasses of wine per week    Comment: Wine sometimes-weekends   Drug use: No   Sexual activity: Not on file  Other Topics Concern   Not on file  Social History Narrative   Right handed    Caffeine use: coffee very morning   No soda   Social Determinants of Health   Financial Resource Strain: Not on file  Food Insecurity: Not on file  Transportation Needs: Not on file  Physical Activity:  Not on file  Stress: Not on file  Social Connections: Not on file  Intimate Partner Violence: Not on file      PHYSICAL EXAM  Vitals:   11/27/20 1536  BP: (!) 158/102  Pulse: 63  Weight: 169 lb (76.7 kg)  Height: 4' 11.5" (1.511 m)    Body mass index is 33.56 kg/m.  Generalized: Well developed, in no acute distress  Cardiology: normal rate and rhythm, no murmur noted Respiratory: clear to auscultation bilaterally   Neurological examination  Mentation: Alert oriented to time, place, history taking. Follows all commands speech and language fluent Cranial nerve II-XII: Pupils were equal round reactive to light. Extraocular movements were full, visual field were full  Motor: The motor testing reveals 5 over 5 strength of all 4 extremities. Good symmetric motor tone is noted throughout.  Sensory: Sensory testing is intact to soft touch on all 4 extremities. No evidence of extinction is noted.  Coordination: Cerebellar testing reveals good finger-nose-finger and heel-to-shin bilaterally.  Gait and station: Gait is normal.   DIAGNOSTIC DATA (LABS, IMAGING, TESTING) - I reviewed patient records, labs, notes, testing and imaging myself where available.  No flowsheet data found.   Lab Results  Component Value Date   WBC 7.3 12/05/2018   HGB 13.7 12/05/2018   HCT 42.1 12/05/2018   MCV 90.0 12/05/2018   PLT 245 12/05/2018      Component Value Date/Time   NA 140 12/05/2018 2243   K 3.4 (L) 12/05/2018 2243   CL 104 12/05/2018 2243   CO2 23 12/05/2018 2243  GLUCOSE 100 (H) 12/05/2018 2243   BUN 16 12/05/2018 2243   CREATININE 0.78 12/05/2018 2243   CALCIUM 9.2 12/05/2018 2243   PROT 7.1 12/05/2018 2243   ALBUMIN 4.6 12/05/2018 2243   AST 28 12/05/2018 2243   ALT 25 12/05/2018 2243   ALKPHOS 59 12/05/2018 2243   BILITOT 1.1 12/05/2018 2243   GFRNONAA >60 12/05/2018 2243   GFRAA >60 12/05/2018 2243   Lab Results  Component Value Date   CHOL 147 09/25/2011   HDL  40.70 09/25/2011   LDLCALC 89 09/25/2011   TRIG 87.0 09/25/2011   CHOLHDL 4 09/25/2011   No results found for: HGBA1C No results found for: VITAMINB12 No results found for: TSH     ASSESSMENT AND PLAN 70 y.o. year old female  has a past medical history of Allergy, Anxiety, Barretts esophagus, Cataract, GERD (gastroesophageal reflux disease), Hiatal hernia, Hyperlipidemia, Hypertension, Migraine headache with aura, Mitral regurgitation (05-19-11), Rheumatoid arthritis ([place]) (12/2012), and Sleep apnea. here with     ICD-10-CM   1. Complicated migraine  123XX123     2. Chronic migraine without aura, with intractable migraine, so stated, with status migrainosus  G43.711 Rimegepant Sulfate (NURTEC) 75 MG TBDP       She is doing very well. We will continue propranolol ER '60mg'$  daily and Nurtec as needed. She will keep an eye on her BP. Usually normal at PCP. She will work on healthy lifestyle changes. Adequate hydration, well balanced diet and regular exercise advised. She will reach out to healthy weight and wellness for weight management. She will follow up with [place] in 1 year, sooner if needed. She verbalizes understanding and agreement with this plan.    No orders of the defined types were placed in this encounter.    Meds ordered this encounter  Medications   propranolol ER (INDERAL LA) 60 MG 24 hr capsule    Sig: Take 1 capsule (60 mg total) by mouth daily.    Dispense:  90 capsule    Refill:  3    Order Specific Question:   Supervising Provider    Answer:   Melvenia Beam [id number]   Rimegepant Sulfate (NURTEC) 75 MG TBDP    Sig: Take 75 mg by mouth daily as needed. For migraines. Take as close to onset of migraine as possible. One daily maximum.    Dispense:  8 tablet    Refill:  11    Patient has copay card; she can have medication for regardless of insurance approval or copay amount.    Order Specific Question:   Supervising Provider    Answer:   Melvenia Beam [id number], FNP-C 11/27/2020, 3:57 PM [facility] [address], [place], [place] [address] [phone]  I have read the note, and I agree with the clinical assessment and plan.  Richard A. Felecia Shelling, MD, PhD, [facility] Certified in Neurology, Clinical Neurophysiology, Sleep Medicine, Pain Medicine and Neuroimaging  [facility] [address], [place], [place] [address] [phone]

## 2020-11-26 NOTE — Patient Instructions (Signed)
Below is our plan:  We will continue propranolol ER '60mg'$  daily and Nurtec as needed for migraine management. Please keep an eye on your blood pressure at home. If it does not come down to less than 140/90, please let your PCP know. Please stay surrounded by family and friends. Consider counseling if needed.   Please make sure you are staying well hydrated. I recommend 50-60 ounces daily. Well balanced diet and regular exercise encouraged. Consistent sleep schedule with 6-8 hours recommended.   Please continue follow up with care team as directed.   Follow up with me in 1 year   You may receive a survey regarding today's visit. I encourage you to leave honest feed back as I do use this information to improve patient care. Thank you for seeing me today!

## 2020-11-27 ENCOUNTER — Encounter: Payer: Self-pay | Admitting: Family Medicine

## 2020-11-27 ENCOUNTER — Ambulatory Visit (INDEPENDENT_AMBULATORY_CARE_PROVIDER_SITE_OTHER): Payer: Medicare Other | Admitting: Family Medicine

## 2020-11-27 VITALS — BP 158/102 | HR 63 | Ht 59.5 in | Wt 169.0 lb

## 2020-11-27 DIAGNOSIS — G43711 Chronic migraine without aura, intractable, with status migrainosus: Secondary | ICD-10-CM | POA: Diagnosis not present

## 2020-11-27 DIAGNOSIS — G43109 Migraine with aura, not intractable, without status migrainosus: Secondary | ICD-10-CM

## 2020-11-27 MED ORDER — PROPRANOLOL HCL ER 60 MG PO CP24: 60.0000 mg | ORAL_CAPSULE | Freq: Every day | ORAL | 3 refills | Status: DC

## 2020-11-27 MED ORDER — NURTEC 75 MG PO TBDP: 75.0000 mg | ORAL_TABLET | Freq: Every day | ORAL | 11 refills | Status: DC | PRN

## 2021-04-25 ENCOUNTER — Ambulatory Visit (INDEPENDENT_AMBULATORY_CARE_PROVIDER_SITE_OTHER): Payer: Medicare Other | Admitting: Physician Assistant

## 2021-04-25 ENCOUNTER — Other Ambulatory Visit (INDEPENDENT_AMBULATORY_CARE_PROVIDER_SITE_OTHER): Payer: Medicare Other

## 2021-04-25 ENCOUNTER — Encounter: Payer: Self-pay | Admitting: Physician Assistant

## 2021-04-25 VITALS — BP 118/84 | HR 68 | Ht 58.25 in | Wt 165.0 lb

## 2021-04-25 DIAGNOSIS — K5900 Constipation, unspecified: Secondary | ICD-10-CM

## 2021-04-25 DIAGNOSIS — K219 Gastro-esophageal reflux disease without esophagitis: Secondary | ICD-10-CM

## 2021-04-25 DIAGNOSIS — D509 Iron deficiency anemia, unspecified: Secondary | ICD-10-CM | POA: Diagnosis not present

## 2021-04-25 DIAGNOSIS — K625 Hemorrhage of anus and rectum: Secondary | ICD-10-CM

## 2021-04-25 LAB — CBC WITH DIFFERENTIAL/PLATELET
Basophils Absolute: 0 10*3/uL (ref 0.0–0.1)
Basophils Relative: 0.4 % (ref 0.0–3.0)
Eosinophils Absolute: 0.1 10*3/uL (ref 0.0–0.7)
Eosinophils Relative: 2.4 % (ref 0.0–5.0)
HCT: 39.2 % (ref 36.0–46.0)
Hemoglobin: 12.5 g/dL (ref 12.0–15.0)
Lymphocytes Relative: 26.4 % (ref 12.0–46.0)
Lymphs Abs: 1.4 10*3/uL (ref 0.7–4.0)
MCHC: 32 g/dL (ref 30.0–36.0)
MCV: 81.8 fl (ref 78.0–100.0)
Monocytes Absolute: 0.5 10*3/uL (ref 0.1–1.0)
Monocytes Relative: 10.1 % (ref 3.0–12.0)
Neutro Abs: 3.3 10*3/uL (ref 1.4–7.7)
Neutrophils Relative %: 60.7 % (ref 43.0–77.0)
Platelets: 344 10*3/uL (ref 150.0–400.0)
RBC: 4.79 Mil/uL (ref 3.87–5.11)
RDW: 18.3 % — ABNORMAL HIGH (ref 11.5–15.5)
WBC: 5.4 10*3/uL (ref 4.0–10.5)

## 2021-04-25 MED ORDER — OMEPRAZOLE 40 MG PO CPDR: 40.0000 mg | DELAYED_RELEASE_CAPSULE | Freq: Every day | ORAL | 3 refills | Status: DC

## 2021-04-25 MED ORDER — NA SULFATE-K SULFATE-MG SULF 17.5-3.13-1.6 GM/177ML PO SOLN: 1.0000 | Freq: Once | ORAL | 0 refills | Status: AC

## 2021-04-25 NOTE — Patient Instructions (Signed)
If you are age 69 or older, your body mass index should be between 23-30. Your Body mass index is 34.19 kg/m. If this is out of the aforementioned range listed, please consider follow up with your Primary Care Provider. ________________________________________________________  The [facility] providers would like to encourage you to use [facility] to communicate with providers for non-urgent requests or questions.  Due to long hold times on the telephone, sending your provider a message by [facility] may be a faster and more efficient way to get a response.  Please allow 48 business hours for a response.  Please remember that this is for non-urgent requests.  _______________________________________________________  Your provider has requested that you go to the basement level for lab work before leaving today. Press "B" on the elevator. The lab is located at the first door on the left as you exit the elevator.  You have been scheduled for an endoscopy and colonoscopy. Please follow the written instructions given to you at your visit today. Please pick up your prep supplies at the pharmacy within the next 1-3 days. If you use inhalers (even only as needed), please bring them with you on the day of your procedure.  Continue Omeprazole 40 mg 1 capsule every morning.  Increase iron to twice daily.  Follow up pending on the results of your Endoscopy/Colonoscopy or as needed.  Thank you for entrusting me with your care and choosing [facility].  Amy Esterwood, PA-C

## 2021-04-25 NOTE — Progress Notes (Signed)
Subjective:    Patient ID: Cynthia Strickland, female    DOB: [birth year], 71 y.o.   MRN: [id number]  HPI Cynthia Strickland is a pleasant 71 year old white female, established with Dr. Havery Moros.  She is referred today by her PCP Dr. Dagoberto Reef for evaluation of iron deficiency anemia and Hemoccult positive stool.  Patient had labs done as part of her routine annual follow-up and this revealed hemoglobin of 11.6/hematocrit 36.5 and MCV of 78. Iron studies were done showing ferritin of 7/serum iron 39/TIBC 532/iron sat 7, B12 and folate were within normal limits.  She had subsequent stool for occult blood done which was positive. She reports that she was also found to be vitamin D deficient and is currently on vitamin D replacement and oral iron therapy once daily.  She says she had been feeling fatigued over the past couple of months.  She says she has had a very difficult past year and a half as she lost her only child, and says she is not really been caring for herself properly since then.  She had not noticed any melena over the past several months but says occasionally she will see a very small amount of blood on the tissue with wiping.  Stools had been irregular, somewhat constipated.  She has started taking fiber Gummies and taking a probiotic and both of those have helped.  She had also noticed that her stool had changed in odor and that she was having a lot more gas.  That has improved with the probiotics.  She is on omeprazole chronically and has no complaints of heartburn or indigestion or dysphagia.  She has not been noticing any abdominal pain, appetite fair no significant weight loss. She does have history of rheumatoid arthritis, she uses Advil off and on but not on a daily basis.  Has meloxicam on her med list but does not take this. She did have EGD here in April 2018 with finding of a 3 cm hiatal hernia there was some nodular mucosa in the duodenal bulb.  Biopsies from the esophagus where there was a  very short area of salmon mucosa showed  no intestinal metaplasia, benign gastric type mucosa with chronic inflammation.  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 She had colonoscopy here in April 2021 which showed a few diverticuli and internal hemorrhoids and was otherwise negative.  Review of Systems. Pertinent positive and negative review of systems were noted in the above HPI section.  All other review of systems was otherwise negative.   Outpatient Encounter Medications as of 04/25/2021  Medication Sig   aspirin EC 81 MG tablet Take 81 mg by mouth daily.   DULoxetine (CYMBALTA) 30 MG capsule Take 30 mg by mouth daily.   FIBER ADULT GUMMIES PO Take 1 tablet by mouth daily.   ibuprofen (ADVIL) 800 MG tablet Take 800 mg by mouth as needed.   meloxicam (MOBIC) 7.5 MG tablet Take 1 tablet (7.5 mg total) by mouth daily. (Patient taking differently: Take 7.5 mg by mouth as needed for pain.)   Na Sulfate-K Sulfate-Mg Sulf 17.5-3.13-1.6 GM/177ML SOLN Take 1 kit by mouth once for 1 dose.   Probiotic Product (ALIGN PO) Take 1 tablet by mouth daily.   promethazine (PHENERGAN) 12.5 MG tablet TAKE 1 TABLET (12.5 MG TOTAL) BY MOUTH EVERY 6 (SIX) HOURS AS NEEDED FOR NAUSEA OR VOMITING.   propranolol ER (INDERAL LA) 60 MG 24 hr capsule Take 1 capsule (60 mg total) by mouth daily.  Rimegepant Sulfate (NURTEC) 75 MG TBDP Take 75 mg by mouth daily as needed. For migraines. Take as close to onset of migraine as possible. One daily maximum.   simvastatin (ZOCOR) 20 MG tablet Take 20 mg by mouth daily at 6 PM.    traMADol (ULTRAM) 50 MG tablet every 6 (six) hours as needed.    traZODone (DESYREL) 50 MG tablet Take 50 mg by mouth at bedtime.   [DISCONTINUED] omeprazole (PRILOSEC) 40 MG capsule Take 1 tablet by mouth once to twice daily   omeprazole (PRILOSEC) 40 MG capsule Take 1 capsule (40 mg total) by mouth daily. Take 1 tablet by mouth once to twice daily   [DISCONTINUED] DULoxetine (CYMBALTA)  60 MG capsule Take 60 mg by mouth daily.   Facility-Administered Encounter Medications as of 04/25/2021  Medication   0.9 %  sodium chloride infusion   Allergies  Allergen Reactions   Codeine Nausea Only and Nausea And Vomiting   Humira [Adalimumab] Rash   Sulfonamide Derivatives Rash   Patient Active Problem List   Diagnosis Date Noted   Aphasia 01/13/2019   White matter abnormality on MRI of brain 01/13/2019   Rheumatoid arthritis ([place]) 08/30/2018   Neck pain 08/30/2018   GERD (gastroesophageal reflux disease) 12/05/2015   PFO (patent foramen ovale) 07/29/2011   BARRETTS ESOPHAGUS 07/06/2009   UNSPECIFIED CONSTIPATION 06/04/2009   HYPERLIPIDEMIA 05/31/2009   Anxiety 73/53/2992   Complicated migraine 42/68/3419   ABDOMINAL BLOATING 05/31/2009   PURE HYPERCHOLESTEROLEMIA 05/09/2009   CHEST PAIN 05/09/2009   Social History   Socioeconomic History   Marital status: Married    Spouse name: Not on file   Number of children: 1   Years of education: Not on file   Highest education level: Not on file  Occupational History   Occupation: retired - worked in opthalmology office  Tobacco Use   Smoking status: Former    Years: 13.00    Types: Cigarettes    Quit date: 03/31/1993    Years since quitting: 28.0   Smokeless tobacco: Never  Vaping Use   Vaping Use: Never used  Substance and Sexual Activity   Alcohol use: Yes    Alcohol/week: 2.0 standard drinks    Types: 2 Glasses of wine per week    Comment: Wine sometimes-weekends   Drug use: No   Sexual activity: Not on file  Other Topics Concern   Not on file  Social History Narrative   Right handed    Caffeine use: coffee very morning   No soda   Social Determinants of Health   Financial Resource Strain: Not on file  Food Insecurity: Not on file  Transportation Needs: Not on file  Physical Activity: Not on file  Stress: Not on file  Social Connections: Not on file  Intimate Partner Violence: Not on file    [name]'s family history includes Colon polyps in her sister; Endometrial cancer in her mother; Heart disease in her father, maternal grandmother, and paternal grandfather; Pancreatic cancer in her maternal grandfather; Stroke in her maternal grandmother and paternal grandfather.      Objective:    Vitals:   04/25/21 1057  BP: 118/84  Pulse: 68    Physical Exam Well-developed well-nourished WF  in no acute distress.  Height, Weight,165  BMI 34.1  HEENT; nontraumatic normocephalic, EOMI, PE R LA, sclera anicteric. Oropharynx;not done today Neck; supple, no JVD Cardiovascular; regular rate and rhythm with S1-S2, no murmur rub or gallop Pulmonary; Clear bilaterally Abdomen;  soft, there is tenderness in the epigastrium and hypogastrium, no guarding or rebound nondistended, no palpable mass or hepatosplenomegaly, bowel sounds are active Rectal; not done today recent Hemoccult positive Skin; benign exam, no jaundice rash or appreciable lesions Extremities; no clubbing cyanosis or edema skin warm and dry Neuro/Psych; alert and oriented x4, grossly nonfocal mood and affect appropriate        Assessment & Plan:   #14 71 year old white female with new finding of iron deficiency anemia and Hemoccult positive stool.  Symptomatic with fatigue  Etiology of iron deficiency anemia is uncertain, occult GI blood loss-consider AVMs, occult neoplasm, chronic gastropathy.  #2 chronic GERD-stable on omeprazole 40 mg daily #3 rheumatoid arthritis #4 history of PFO #5 history of migraines 6.  Diverticulosis 7.  Internal hemorrhoids  Plan; continue omeprazole 40 mg p.o. every morning AC breakfast Check CBC today Will need to have repeat iron studies in about 3 months. Asked patient to increase ferrous sulfate to 325 mg p.o. twice daily, take with food Patient will be scheduled for EGD and colonoscopy with Dr. Havery Moros.  Both procedures were discussed in detail with the patient including  indications risks and benefits and she is agreeable to proceed If endoscopic evaluation is unrevealing will consider capsule endoscopy which was also discussed with the patient today.  Yaasir Menken Genia Harold PA-C 04/25/2021   Cc: Karleen Hampshire., MD

## 2021-04-26 NOTE — Progress Notes (Signed)
Agree with assessment and plan as outlined.  

## 2021-05-28 ENCOUNTER — Institutional Professional Consult (permissible substitution): Payer: Medicare Other | Admitting: Plastic Surgery

## 2021-06-06 ENCOUNTER — Ambulatory Visit (AMBULATORY_SURGERY_CENTER): Payer: Medicare Other | Admitting: Gastroenterology

## 2021-06-06 ENCOUNTER — Encounter: Payer: Self-pay | Admitting: Gastroenterology

## 2021-06-06 ENCOUNTER — Other Ambulatory Visit: Payer: Self-pay

## 2021-06-06 VITALS — BP 124/74 | HR 58 | Temp 98.9°F | Resp 15 | Ht <= 58 in | Wt 165.0 lb

## 2021-06-06 DIAGNOSIS — K5730 Diverticulosis of large intestine without perforation or abscess without bleeding: Secondary | ICD-10-CM

## 2021-06-06 DIAGNOSIS — D122 Benign neoplasm of ascending colon: Secondary | ICD-10-CM

## 2021-06-06 DIAGNOSIS — K2980 Duodenitis without bleeding: Secondary | ICD-10-CM

## 2021-06-06 DIAGNOSIS — K31819 Angiodysplasia of stomach and duodenum without bleeding: Secondary | ICD-10-CM | POA: Diagnosis not present

## 2021-06-06 DIAGNOSIS — K449 Diaphragmatic hernia without obstruction or gangrene: Secondary | ICD-10-CM

## 2021-06-06 DIAGNOSIS — D509 Iron deficiency anemia, unspecified: Secondary | ICD-10-CM | POA: Diagnosis not present

## 2021-06-06 DIAGNOSIS — K648 Other hemorrhoids: Secondary | ICD-10-CM

## 2021-06-06 DIAGNOSIS — K3189 Other diseases of stomach and duodenum: Secondary | ICD-10-CM | POA: Diagnosis not present

## 2021-06-06 DIAGNOSIS — K5520 Angiodysplasia of colon without hemorrhage: Secondary | ICD-10-CM | POA: Diagnosis not present

## 2021-06-06 MED ORDER — SODIUM CHLORIDE 0.9 % IV SOLN: 500.0000 mL | Freq: Once | INTRAVENOUS | Status: DC

## 2021-06-06 NOTE — Op Note (Signed)
[facility] ?Patient Name: Cynthia Strickland ?Procedure Date: 06/06/2021 1:58 PM ?MRN: [id number] ?Endoscopist: Carlota Raspberry. Havery Moros , MD ?Age: 71 ?Referring MD:  ?Date of Birth: 1950-07-11 ?Gender: Female ?Account #: [id number] ?Procedure:                Upper GI endoscopy ?Indications:              Iron deficiency anemia ?Medicines:                Monitored Anesthesia Care ?Procedure:                Pre-Anesthesia Assessment: ?                          - Prior to the procedure, a History and Physical  ?                          was performed, and patient medications and  ?                          allergies were reviewed. The patient's tolerance of  ?                          previous anesthesia was also reviewed. The risks  ?                          and benefits of the procedure and the sedation  ?                          options and risks were discussed with the patient.  ?                          All questions were answered, and informed consent  ?                          was obtained. Prior Anticoagulants: The patient has  ?                          taken no previous anticoagulant or antiplatelet  ?                          agents. ASA Grade Assessment: II - A patient with  ?                          mild systemic disease. After reviewing the risks  ?                          and benefits, the patient was deemed in  ?                          satisfactory condition to undergo the procedure. ?                          After obtaining informed consent, the endoscope was  ?  passed under direct vision. Throughout the  ?                          procedure, the patient's blood pressure, pulse, and  ?                          oxygen saturations were monitored continuously. The  ?                          GIF [id number] #[id number] was introduced through the  ?                          mouth, and advanced to the second part of duodenum.  ?                          The upper GI endoscopy was  accomplished without  ?                          difficulty. The patient tolerated the procedure  ?                          well. ?Scope In: ?Scope Out: ?Findings:                 Esophagogastric landmarks were identified: the  ?                          Z-line was found at 34 cm, the gastroesophageal  ?                          junction was found at 34 cm and the upper extent of  ?                          the gastric folds was found at 36 cm from the  ?                          incisors. ?                          A 2 cm hiatal hernia was present. ?                          The Z-line was slightly irregular but did not meet  ?                          criteria for Barrett's. ?                          The exam of the esophagus was otherwise normal. ?                          The entire examined stomach was normal. Biopsies  ?                          were taken with a cold  forceps for Helicobacter  ?                          pylori testing. ?                          Nodular mucosa was found in the duodenal bulb  ?                          consistent with know ectopic gastric mucosa from  ?                          prior exams. ?                          Two diminutive angiodysplastic lesions without  ?                          bleeding were found in the second portion of the  ?                          duodenum. ?                          The exam of the duodenum was otherwise normal. ?                          Biopsies for histology were taken with a cold  ?                          forceps in the duodenal bulb and in the second  ?                          portion of the duodenum for evaluation of celiac  ?                          disease. ?Complications:            No immediate complications. Estimated blood loss:  ?                          Minimal. ?Estimated Blood Loss:     Estimated blood loss was minimal. ?Impression:               - Esophagogastric landmarks identified. ?                          - 2 cm  hiatal hernia. ?                          - Z-line slightly irregular, did not meet criteria  ?                          for Barrett's. ?                          - Normal stomach. Biopsied. ?                          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                          - Two non-bleeding angiodysplastic lesions in the  ?                          duodenum. ?                          - Normal duodenum otherwise ?                          - Biopsies were taken with a cold forceps for  ?                          evaluation of celiac disease. ?                          Small bowel AVMs could be cause of iron deficiency,  ?                          quite possible the patient has more lesions distal  ?                          to the extent reached of this exam. ?Recommendation:           - Patient has a contact number available for  ?                          emergencies. The signs and symptoms of potential  ?                          delayed complications were discussed with the  ?                          patient. Return to normal activities tomorrow.  ?                          Written discharge instructions were provided to the  ?                          patient. ?                          - Resume previous diet. ?                          - Continue present medications. ?                          - Await pathology results. ?                          - trend Hgb / iron studies on iron supplementation ?Remo Lipps P. Havery Moros, MD ?06/06/2021 2:51:49 PM ?This report has been signed electronically. ?

## 2021-06-06 NOTE — Progress Notes (Signed)
[facility] History and Physical ? ? ?Primary Care Physician:  Karleen Hampshire., MD ? ? ?Reason for Procedure:   Iron deficiency anemia ? ?Plan:    EGD and colonoscopy ? ? ? ? ?HPI: Cynthia Strickland is a 71 y.o. female  here for colonoscopy and EGD to evaluate IDA. Patient denies any bowel symptoms at this time other than scant bleeding from hemorrhoids. Otherwise feels well without any cardiopulmonary symptoms. On iron. No changes since she was last seen in the office on 04/25/21. Colonoscopy in 2021 was normal. ? ? ?Past Medical History:  ?Diagnosis Date  ? Allergy   ? seasonal   ? Anxiety   ? Barretts esophagus   ? Cataract   ? GERD (gastroesophageal reflux disease)   ? Hiatal hernia   ? Hyperlipidemia   ? Hypertension   ? white coat syndrome  ? Migraine headache with aura   ? Mitral regurgitation 05-19-11  ?  and Tricuspid Regurgitation(had Echo w/Bubble study)for migraines  ? Rheumatoid arthritis ([place]) 12/2012  ? Sleep apnea   ? 4/21-not using cpap, getting fitted for mouthguard  ? ? ?Past Surgical History:  ?Procedure Laterality Date  ? [place]  ? COLONOSCOPY  2011  ? LAPAROSCOPIC OOPHORECTOMY  2005  ? UPPER GASTROINTESTINAL ENDOSCOPY    ? VAGINAL HYSTERECTOMY  1995  ? ? ?Prior to Admission medications   ?Medication Sig Start Date End Date Taking? Authorizing Provider  ?aspirin EC 81 MG tablet Take 81 mg by mouth daily.   Yes [provider]  ?DULoxetine (CYMBALTA) 30 MG capsule Take 30 mg by mouth daily. 03/04/21  Yes [provider]  ?ibuprofen (ADVIL) 800 MG tablet Take 800 mg by mouth as needed. 04/03/21  Yes [provider]  ?meloxicam (MOBIC) 7.5 MG tablet Take 1 tablet (7.5 mg total) by mouth daily. ?Patient taking differently: Take 7.5 mg by mouth as needed for pain. 08/30/18  Yes Sater, Nanine Means, MD  ?omeprazole (PRILOSEC) 40 MG capsule Take 1 capsule (40 mg total) by mouth daily. Take 1 tablet by mouth once to twice daily 04/25/21  Yes Esterwood, Amy S,  PA-C  ?Probiotic Product (ALIGN PO) Take 1 tablet by mouth daily.   Yes [provider]  ?propranolol ER (INDERAL LA) 60 MG 24 hr capsule Take 1 capsule (60 mg total) by mouth daily. 11/27/20  Yes Lomax, Amy, NP  ?simvastatin (ZOCOR) 20 MG tablet Take 20 mg by mouth daily at 6 PM.  06/27/16  Yes [provider]  ?traMADol (ULTRAM) 50 MG tablet every 6 (six) hours as needed.  01/12/19  Yes [provider]  ?traZODone (DESYREL) 50 MG tablet Take 50 mg by mouth at bedtime.   Yes [provider]  ?FIBER ADULT GUMMIES PO Take 1 tablet by mouth daily. ?Patient not taking: Reported on 06/06/2021    [provider]  ?promethazine (PHENERGAN) 12.5 MG tablet TAKE 1 TABLET (12.5 MG TOTAL) BY MOUTH EVERY 6 (SIX) HOURS AS NEEDED FOR NAUSEA OR VOMITING. ?Patient not taking: Reported on 06/06/2021 09/21/19   Britt Bottom, MD  ?Rimegepant Sulfate (NURTEC) 75 MG TBDP Take 75 mg by mouth daily as needed. For migraines. Take as close to onset of migraine as possible. One daily maximum. ?Patient not taking: Reported on 06/06/2021 11/27/20   Debbora Presto, NP  ? ? ?Current Outpatient Medications  ?Medication Sig Dispense Refill  ? aspirin EC 81 MG tablet Take 81 mg by mouth daily.    ?  DULoxetine (CYMBALTA) 30 MG capsule Take 30 mg by mouth daily.    ? ibuprofen (ADVIL) 800 MG tablet Take 800 mg by mouth as needed.    ? meloxicam (MOBIC) 7.5 MG tablet Take 1 tablet (7.5 mg total) by mouth daily. (Patient taking differently: Take 7.5 mg by mouth as needed for pain.) 30 tablet 5  ? omeprazole (PRILOSEC) 40 MG capsule Take 1 capsule (40 mg total) by mouth daily. Take 1 tablet by mouth once to twice daily 90 capsule 3  ? Probiotic Product (ALIGN PO) Take 1 tablet by mouth daily.    ? propranolol ER (INDERAL LA) 60 MG 24 hr capsule Take 1 capsule (60 mg total) by mouth daily. 90 capsule 3  ? simvastatin (ZOCOR) 20 MG tablet Take 20 mg by mouth daily at 6 PM.     ? traMADol (ULTRAM) 50 MG tablet every 6  (six) hours as needed.     ? traZODone (DESYREL) 50 MG tablet Take 50 mg by mouth at bedtime.    ? FIBER ADULT GUMMIES PO Take 1 tablet by mouth daily. (Patient not taking: Reported on 06/06/2021)    ? promethazine (PHENERGAN) 12.5 MG tablet TAKE 1 TABLET (12.5 MG TOTAL) BY MOUTH EVERY 6 (SIX) HOURS AS NEEDED FOR NAUSEA OR VOMITING. (Patient not taking: Reported on 06/06/2021) 30 tablet 1  ? Rimegepant Sulfate (NURTEC) 75 MG TBDP Take 75 mg by mouth daily as needed. For migraines. Take as close to onset of migraine as possible. One daily maximum. (Patient not taking: Reported on 06/06/2021) 8 tablet 11  ? ?Current Facility-Administered Medications  ?Medication Dose Route Frequency Provider Last Rate Last Admin  ? 0.9 %  sodium chloride infusion  500 mL Intravenous Once Geneve Kimpel, Carlota Raspberry, MD      ? 0.9 %  sodium chloride infusion  500 mL Intravenous Once Douglass Duns[place], Carlota Raspberry, MD      ? ? ?Allergies as of 06/06/2021 - Review Complete 06/06/2021  ?Allergen Reaction Noted  ? Codeine Nausea Only and Nausea And Vomiting 11/25/2013  ? Humira [adalimumab] Rash 07/12/2019  ? Sulfonamide derivatives Rash 05/31/2009  ? ? ?Family History  ?Problem Relation Age of Onset  ? Endometrial cancer Mother   ? Heart disease Father   ? Colon polyps Sister   ? Heart disease Maternal Grandmother   ? Stroke Maternal Grandmother   ? Pancreatic cancer Maternal Grandfather   ? Heart disease Paternal Grandfather   ? Stroke Paternal Grandfather   ? Colon cancer Neg Hx   ? Esophageal cancer Neg Hx   ? Rectal cancer Neg Hx   ? Stomach cancer Neg Hx   ? ? ?Social History  ? ?Socioeconomic History  ? Marital status: Married  ?  Spouse name: Not on file  ? Number of children: 1  ? Years of education: Not on file  ? Highest education level: Not on file  ?Occupational History  ? Occupation: retired - worked in opthalmology office  ?Tobacco Use  ? Smoking status: Former  ?  Years: 13.00  ?  Types: Cigarettes  ?  Quit date: 03/31/1993  ?  Years since  quitting: 28.2  ? Smokeless tobacco: Never  ?Vaping Use  ? Vaping Use: Never used  ?Substance and Sexual Activity  ? Alcohol use: Yes  ?  Alcohol/week: 2.0 standard drinks  ?  Types: 2 Glasses of wine per week  ?  Comment: Wine sometimes-weekends  ? Drug use: No  ? Sexual activity: Not on  file  ?Other Topics Concern  ? Not on file  ?Social History Narrative  ? Right handed   ? Caffeine use: coffee very morning  ? No soda  ? ?Social Determinants of Health  ? ?Financial Resource Strain: Not on file  ?Food Insecurity: Not on file  ?Transportation Needs: Not on file  ?Physical Activity: Not on file  ?Stress: Not on file  ?Social Connections: Not on file  ?Intimate Partner Violence: Not on file  ? ? ?Review of Systems: ?All other review of systems negative except as mentioned in the HPI. ? ?Physical Exam: ?Vital signs ?BP (!) 150/97   Pulse 65   Temp 98.9 ?F (37.2 ?C)   Ht '4\' 10"'$  (1.473 m)   Wt 165 lb (74.8 kg)   SpO2 97%   BMI 34.49 kg/m?  ? ?General:   Alert,  Well-developed, pleasant and cooperative in NAD ?Lungs:  Clear throughout to auscultation.   ?Heart:  Regular rate and rhythm ?Abdomen:  Soft, nontender and nondistended.   ?Neuro/Psych:  Alert and cooperative. Normal mood and affect. A and O x 3 ? ?Jolly Mango, MD ?[facility] ? ? ?

## 2021-06-06 NOTE — Patient Instructions (Addendum)
Thank you for allowing [place] to care for you today. ?Await final results, approximately 1-2 weeks.  Recommendations will be made at that time for future colonoscopy. ?Resume previous diet and medications today.  Return to normal daily activities tomorrow. ? ?YOU HAD AN ENDOSCOPIC PROCEDURE TODAY AT [facility]:   Refer to the procedure report that was given to you for any specific questions about what was found during the examination.  If the procedure report does not answer your questions, please call your gastroenterologist to clarify.  If you requested that your care partner not be given the details of your procedure findings, then the procedure report has been included in a sealed envelope for you to review at your convenience later. ? ?YOU SHOULD EXPECT: Some feelings of bloating in the abdomen. Passage of more gas than usual.  Walking can help get rid of the air that was put into your GI tract during the procedure and reduce the bloating. If you had a lower endoscopy (such as a colonoscopy or flexible sigmoidoscopy) you may notice spotting of blood in your stool or on the toilet paper. If you underwent a bowel prep for your procedure, you may not have a normal bowel movement for a few days. ? ?Please Note:  You might notice some irritation and congestion in your nose or some drainage.  This is from the oxygen used during your procedure.  There is no need for concern and it should clear up in a day or so. ? ?SYMPTOMS TO REPORT IMMEDIATELY: ? ?Following lower endoscopy (colonoscopy or flexible sigmoidoscopy): ? Excessive amounts of blood in the stool ? Significant tenderness or worsening of abdominal pains ? Swelling of the abdomen that is new, acute ? Fever of 100?F or higher ? ?Following upper endoscopy (EGD) ? Vomiting of blood or coffee ground material ? New chest pain or pain under the shoulder blades ? Painful or persistently difficult swallowing ? New shortness of breath ? Fever of 100?F or  higher ? Black, tarry-looking stools ? ?For urgent or emergent issues, a gastroenterologist can be reached at any hour by calling [phone]. ?Do not use MyChart messaging for urgent concerns.  ? ? ?DIET:  We do recommend a small meal at first, but then you may proceed to your regular diet.  Drink plenty of fluids but you should avoid alcoholic beverages for 24 hours. ? ?ACTIVITY:  You should plan to take it easy for the rest of today and you should NOT DRIVE or use heavy machinery until tomorrow (because of the sedation medicines used during the test).   ? ?FOLLOW UP: ?Our staff will call the number listed on your records 48-72 hours following your procedure to check on you and address any questions or concerns that you may have regarding the information given to you following your procedure. If we do not reach you, we will leave a message.  We will attempt to reach you two times.  During this call, we will ask if you have developed any symptoms of COVID 19. If you develop any symptoms (ie: fever, flu-like symptoms, shortness of breath, cough etc.) before then, please call [phone].  If you test positive for Covid 19 in the 2 weeks post procedure, please call and report this information to [place].   ? ?If any biopsies were taken you will be contacted by phone or by letter within the next 1-3 weeks.  Please call us at [phone] if you have not heard about  the biopsies in 3 weeks.  ? ? ?SIGNATURES/CONFIDENTIALITY: ?You and/or your care partner have signed paperwork which will be entered into your electronic medical record.  These signatures attest to the fact that that the information above on your After Visit Summary has been reviewed and is understood.  Full responsibility of the confidentiality of this discharge information lies with you and/or your care-partner.  ?

## 2021-06-06 NOTE — Op Note (Signed)
[place] ?Patient Name: Cynthia Strickland ?Procedure Date: 06/06/2021 1:50 PM ?MRN: [id number] ?Endoscopist: Carlota Raspberry. Havery Moros , MD ?Age: 71 ?Referring MD:  ?Date of Birth: 03-Dec-1950 ?Gender: Female ?Account #: [id number] ?Procedure:                Colonoscopy ?Indications:              Iron deficiency anemia ?Medicines:                Monitored Anesthesia Care ?Procedure:                Pre-Anesthesia Assessment: ?                          - Prior to the procedure, a History and Physical  ?                          was performed, and patient medications and  ?                          allergies were reviewed. The patient's tolerance of  ?                          previous anesthesia was also reviewed. The risks  ?                          and benefits of the procedure and the sedation  ?                          options and risks were discussed with the patient.  ?                          All questions were answered, and informed consent  ?                          was obtained. Prior Anticoagulants: The patient has  ?                          taken no previous anticoagulant or antiplatelet  ?                          agents. ASA Grade Assessment: II - A patient with  ?                          mild systemic disease. After reviewing the risks  ?                          and benefits, the patient was deemed in  ?                          satisfactory condition to undergo the procedure. ?                          After obtaining informed consent, the colonoscope  ?  was passed under direct vision. Throughout the  ?                          procedure, the patient's blood pressure, pulse, and  ?                          oxygen saturations were monitored continuously. The  ?                          Olympus PCF-H190DL (#[id number]) Colonoscope was  ?                          introduced through the anus and advanced to the the  ?                          terminal ileum, with  identification of the  ?                          appendiceal orifice and IC valve. The colonoscopy  ?                          was performed without difficulty. The patient  ?                          tolerated the procedure well. The quality of the  ?                          bowel preparation was good. The terminal ileum,  ?                          ileocecal valve, appendiceal orifice, and rectum  ?                          were photographed. ?Scope In: 2:21:15 PM ?Scope Out: 2:39:37 PM ?Scope Withdrawal Time: 0 hours 14 minutes 54 seconds  ?Total Procedure Duration: 0 hours 18 minutes 22 seconds  ?Findings:                 The perianal and digital rectal examinations were  ?                          normal. ?                          The terminal ileum appeared normal. ?                          Three sessile polyps were found in the ascending  ?                          colon. The polyps were diminutive in size. These  ?                          polyps were removed with a cold snare. Resection  ?  and retrieval were complete. ?                          A few medium-mouthed diverticula were found in the  ?                          left colon. ?                          Internal hemorrhoids were found during retroflexion. ?                          The exam was otherwise without abnormality. ?Complications:            No immediate complications. Estimated blood loss:  ?                          Minimal. ?Estimated Blood Loss:     Estimated blood loss was minimal. ?Impression:               - The examined portion of the ileum was normal. ?                          - Three diminutive polyps in the ascending colon,  ?                          removed with a cold snare. Resected and retrieved. ?                          - Diverticulosis in the left colon. ?                          - Internal hemorrhoids. ?                          - The examination was otherwise normal. ?                           No cause for iron deficiency anemia on colonoscopy. ?Recommendation:           - Patient has a contact number available for  ?                          emergencies. The signs and symptoms of potential  ?                          delayed complications were discussed with the  ?                          patient. Return to normal activities tomorrow.  ?                          Written discharge instructions were provided to the  ?                          patient. ?                          -  Resume previous diet. ?                          - Continue present medications. ?                          - Await pathology results. ?Carlota Raspberry. Yetta Marceaux, MD ?06/06/2021 2:45:30 PM ?This report has been signed electronically. ?

## 2021-06-06 NOTE — Progress Notes (Signed)
Called to room to assist during endoscopic procedure.  Patient ID and intended procedure confirmed with present staff. Received instructions for my participation in the procedure from the performing physician.  

## 2021-06-06 NOTE — Progress Notes (Signed)
Sedate, gd SR, tolerated procedure well, VSS, report to RN 

## 2021-06-10 ENCOUNTER — Telehealth: Payer: Self-pay | Admitting: *Deleted

## 2021-06-10 NOTE — Telephone Encounter (Signed)
?  Follow up Call- ? ?Call back number 06/06/2021 07/25/2019  ?Post procedure Call Back phone  # [phone] cell  ?Permission to leave phone message Yes Yes  ?Some recent data might be hidden  ?  ? ?Patient questions: ? ?Message left to call us if necessary. ? ?

## 2021-06-10 NOTE — Telephone Encounter (Signed)
Second follow up call attempt. No answer,  Message left earlier this morning ?

## 2021-06-11 ENCOUNTER — Other Ambulatory Visit: Payer: Self-pay

## 2021-06-11 DIAGNOSIS — D509 Iron deficiency anemia, unspecified: Secondary | ICD-10-CM

## 2021-06-11 NOTE — Progress Notes (Signed)
CBC and TIBC ferritin order entered. Patient was notified to go to the lab by Dr. Havery Moros (path results). ?

## 2021-06-17 ENCOUNTER — Encounter: Payer: Self-pay | Admitting: Gastroenterology

## 2021-06-20 ENCOUNTER — Other Ambulatory Visit (INDEPENDENT_AMBULATORY_CARE_PROVIDER_SITE_OTHER): Payer: Medicare Other

## 2021-06-20 ENCOUNTER — Encounter: Payer: Self-pay | Admitting: Gastroenterology

## 2021-06-20 DIAGNOSIS — D509 Iron deficiency anemia, unspecified: Secondary | ICD-10-CM

## 2021-06-20 LAB — CBC WITH DIFFERENTIAL/PLATELET
Basophils Absolute: 0 10*3/uL (ref 0.0–0.1)
Basophils Relative: 0.5 % (ref 0.0–3.0)
Eosinophils Absolute: 0.1 10*3/uL (ref 0.0–0.7)
Eosinophils Relative: 1.8 % (ref 0.0–5.0)
HCT: 40.6 % (ref 36.0–46.0)
Hemoglobin: 13.5 g/dL (ref 12.0–15.0)
Lymphocytes Relative: 31 % (ref 12.0–46.0)
Lymphs Abs: 1.9 10*3/uL (ref 0.7–4.0)
MCHC: 33.3 g/dL (ref 30.0–36.0)
MCV: 87.8 fl (ref 78.0–100.0)
Monocytes Absolute: 0.6 10*3/uL (ref 0.1–1.0)
Monocytes Relative: 9.9 % (ref 3.0–12.0)
Neutro Abs: 3.6 10*3/uL (ref 1.4–7.7)
Neutrophils Relative %: 56.8 % (ref 43.0–77.0)
Platelets: 295 10*3/uL (ref 150.0–400.0)
RBC: 4.62 Mil/uL (ref 3.87–5.11)
RDW: 19 % — ABNORMAL HIGH (ref 11.5–15.5)
WBC: 6.3 10*3/uL (ref 4.0–10.5)

## 2021-06-20 LAB — IBC + FERRITIN
Ferritin: 30.7 ng/mL (ref 10.0–291.0)
Iron: 99 ug/dL (ref 42–145)
Saturation Ratios: 25.7 % (ref 20.0–50.0)
TIBC: 385 ug/dL (ref 250.0–450.0)
Transferrin: 275 mg/dL (ref 212.0–360.0)

## 2021-07-12 ENCOUNTER — Encounter: Payer: Self-pay | Admitting: Plastic Surgery

## 2021-07-12 ENCOUNTER — Ambulatory Visit (INDEPENDENT_AMBULATORY_CARE_PROVIDER_SITE_OTHER): Payer: Medicare Other | Admitting: Plastic Surgery

## 2021-07-12 VITALS — BP 147/101 | HR 62 | Ht 60.0 in | Wt 165.2 lb

## 2021-07-12 DIAGNOSIS — M546 Pain in thoracic spine: Secondary | ICD-10-CM

## 2021-07-12 DIAGNOSIS — M542 Cervicalgia: Secondary | ICD-10-CM | POA: Diagnosis not present

## 2021-07-12 DIAGNOSIS — N62 Hypertrophy of breast: Secondary | ICD-10-CM

## 2021-07-12 DIAGNOSIS — M549 Dorsalgia, unspecified: Secondary | ICD-10-CM | POA: Insufficient documentation

## 2021-07-12 DIAGNOSIS — M069 Rheumatoid arthritis, unspecified: Secondary | ICD-10-CM

## 2021-07-12 DIAGNOSIS — G8929 Other chronic pain: Secondary | ICD-10-CM

## 2021-07-12 NOTE — Progress Notes (Signed)
? ?  Patient ID: Cynthia Strickland, female    DOB: [birth year], 71 y.o.   MRN: [id number] ? ? ?Chief Complaint  ?Patient presents with  ? Consult  ?     ? Breast Problem  ? ? ?Mammary Hyperplasia: ?The patient is a 71 y.o. female with a history of mammary hyperplasia for several years.  She has extremely large breasts causing symptoms that include the following: ?Back pain in the upper and lower back, including neck pain. She pulls or pins her bra straps to provide better lift and relief of the pressure and pain. She notices relief by holding her breast up manually.  Her shoulder straps cause grooves and pain and pressure that requires padding for relief. Pain medication is sometimes required with motrin and tylenol.  Activities that are hindered by enlarged breasts include: exercise and running.  She has tried supportive clothing as well as fitted bras without improvement. ? ?Her breasts are extremely large and fairly symmetric.  She has hyperpigmentation of the inframammary area on both sides.  The sternal to nipple distance on the right is 28 cm and the left is 29 cm.  The IMF distance is 13 cm.  She is 5 feet tall and weighs 165 pounds.  The BMI = 32.2 kg/m?Marland Kitchen  Preoperative bra size = 38 DD cup.  She would like to be a B cup. The estimated excess breast tissue to be removed at the time of surgery = 400 grams on the left and 400 grams on the right.  Mammogram history: 8/22 negative.  Family history of breast cancer:  no.  Tobacco use: quit years ago.   The patient expresses the desire to pursue surgical intervention. She has RA and is on meds but only started one month ago. ? ? ? ?Review of Systems  ?Constitutional: Negative.   ?Eyes: Negative.   ?Respiratory: Negative.  Negative for chest tightness and shortness of breath.   ?Cardiovascular:  Negative for leg swelling.  ?Gastrointestinal: Negative.   ?Endocrine: Negative.   ?Genitourinary: Negative.   ?Musculoskeletal:  Positive for back pain and neck pain.  ?Skin:   Positive for rash.  ?Hematological: Negative.   ?Psychiatric/Behavioral: Negative.    ? ?Past Medical History:  ?Diagnosis Date  ? Allergy   ? seasonal   ? Anxiety   ? Barretts esophagus   ? Cataract   ? GERD (gastroesophageal reflux disease)   ? Hiatal hernia   ? Hyperlipidemia   ? Hypertension   ? white coat syndrome  ? Migraine headache with aura   ? Mitral regurgitation 05-19-11  ?  and Tricuspid Regurgitation(had Echo w/Bubble study)for migraines  ? Rheumatoid arthritis ([place]) 12/2012  ? Sleep apnea   ? 4/21-not using cpap, getting fitted for mouthguard  ?  ?Past Surgical History:  ?Procedure Laterality Date  ? [place]  ? COLONOSCOPY  2011  ? LAPAROSCOPIC OOPHORECTOMY  2005  ? UPPER GASTROINTESTINAL ENDOSCOPY    ? VAGINAL HYSTERECTOMY  1995  ?  ? ? ?Current Outpatient Medications:  ?  aspirin EC 81 MG tablet, Take 81 mg by mouth daily., Disp: , Rfl:  ?  Cholecalciferol (VITAMIN D) 50 MCG (2000 UT) tablet, Take 2,000 Units by mouth daily., Disp: , Rfl:  ?  DULoxetine (CYMBALTA) 30 MG capsule, Take 30 mg by mouth daily., Disp: , Rfl:  ?  ferrous sulfate 325 (65 FE) MG tablet, Take 1 tablet by mouth daily with breakfast., Disp: , Rfl:  ?  FIBER ADULT GUMMIES PO, Take 1 tablet by mouth daily., Disp: , Rfl:  ?  hydroxychloroquine (PLAQUENIL) 200 MG tablet, Take 200 mg by mouth 2 (two) times daily., Disp: , Rfl:  ?  ibuprofen (ADVIL) 800 MG tablet, Take 800 mg by mouth as needed., Disp: , Rfl:  ?  meloxicam (MOBIC) 7.5 MG tablet, Take 1 tablet (7.5 mg total) by mouth daily. (Patient taking differently: Take 7.5 mg by mouth as needed for pain.), Disp: 30 tablet, Rfl: 5 ?  omeprazole (PRILOSEC) 40 MG capsule, Take 1 capsule (40 mg total) by mouth daily. Take 1 tablet by mouth once to twice daily, Disp: 90 capsule, Rfl: 3 ?  Probiotic Product (ALIGN PO), Take 1 tablet by mouth daily., Disp: , Rfl:  ?  promethazine (PHENERGAN) 12.5 MG tablet, TAKE 1 TABLET (12.5 MG TOTAL) BY MOUTH EVERY 6 (SIX) HOURS AS  NEEDED FOR NAUSEA OR VOMITING., Disp: 30 tablet, Rfl: 1 ?  propranolol ER (INDERAL LA) 60 MG 24 hr capsule, Take 1 capsule (60 mg total) by mouth daily., Disp: 90 capsule, Rfl: 3 ?  Rimegepant Sulfate (NURTEC) 75 MG TBDP, Take 75 mg by mouth daily as needed. For migraines. Take as close to onset of migraine as possible. One daily maximum., Disp: 8 tablet, Rfl: 11 ?  simvastatin (ZOCOR) 20 MG tablet, Take 20 mg by mouth daily at 6 PM. , Disp: , Rfl:  ?  traMADol (ULTRAM) 50 MG tablet, every 6 (six) hours as needed. , Disp: , Rfl:  ?  traZODone (DESYREL) 50 MG tablet, Take 50 mg by mouth at bedtime., Disp: , Rfl:   ? ?Objective:  ? ?Vitals:  ? 07/12/21 1330  ?BP: (!) 147/101  ?Pulse: 62  ?SpO2: 97%  ? ? ?Physical Exam ?Vitals reviewed.  ?Constitutional:   ?   Appearance: Normal appearance.  ?HENT:  ?   Head: Normocephalic and atraumatic.  ?Cardiovascular:  ?   Rate and Rhythm: Normal rate.  ?   Pulses: Normal pulses.  ?Pulmonary:  ?   Effort: Pulmonary effort is normal. No respiratory distress.  ?Abdominal:  ?   General: There is no distension.  ?   Palpations: Abdomen is soft.  ?   Tenderness: There is no abdominal tenderness.  ?Skin: ?   General: Skin is warm.  ?   Capillary Refill: Capillary refill takes less than 2 seconds.  ?   Coloration: Skin is not jaundiced.  ?   Findings: No bruising.  ?Neurological:  ?   Mental Status: She is alert and oriented to person, place, and time.  ?Psychiatric:     ?   Mood and Affect: Mood normal.     ?   Behavior: Behavior normal.     ?   Thought Content: Thought content normal.     ?   Judgment: Judgment normal.  ? ? ?Assessment & Plan:  ?Rheumatoid arthritis, involving unspecified site, unspecified whether rheumatoid factor present ([place]) ? ?Chronic bilateral thoracic back pain ? ?Neck pain ? ?Symptomatic mammary hypertrophy ? ?The procedure the patient selected and that was best for the patient was discussed. The risk were discussed and include but not limited to the following:   Breast asymmetry, fluid accumulation, firmness of the breast, inability to breast feed, loss of nipple or areola, skin loss, change in skin and nipple sensation, fat necrosis of the breast tissue, bleeding, infection and healing delay.  There are risks of anesthesia and injury to nerves or blood vessels.  Allergic  reaction to tape, suture and skin glue are possible.  There will be swelling.  Any of these can lead to the need for revisional surgery.  A breast reduction has potential to interfere with diagnostic procedures in the future.  This procedure is best done when the breast is fully developed.  Changes in the breast will continue to occur over time: pregnancy, weight gain or weigh loss. ?   ?Total time: 45 minutes. This includes time spent with the patient during the visit as well as time spent before and after the visit reviewing the chart, documenting the encounter, ordering pertinent studies and literature for the patient.   ?Physical therapy:  ordered ?Mammogram:  release of info requested from [facility] reduction information given. ? ?The patient is a good plan for bilateral breast reduction with possible lateral liposuction.  I would like her to stop her rheumatoid arthritis medicine a month before surgery and not restarted until a month after the surgery. ? ?Pictures were obtained of the patient and placed in the chart with the patient's or guardian's permission. ? ? ?Loel Lofty Noris Kulinski, DO ?

## 2021-07-15 ENCOUNTER — Encounter: Payer: Self-pay | Admitting: Plastic Surgery

## 2021-07-24 ENCOUNTER — Telehealth: Payer: Self-pay | Admitting: Surgical

## 2021-07-24 NOTE — Telephone Encounter (Signed)
Patient called to check on status of prior auth; following up on consult appt on 07/12/21. ? ?Please advise at [phone]. ?

## 2021-07-25 ENCOUNTER — Telehealth: Payer: Self-pay

## 2021-07-25 NOTE — Telephone Encounter (Addendum)
Called patient, LMVM advised to hold plaquenil 1 month prior to surgery and 1 month afterwards. PT 1st appointment is 5/9 which will probably be a consult. Suggested to start to hold on the completed 4th week of PT. Call if she has any further questions.  ?

## 2021-07-25 NOTE — Telephone Encounter (Addendum)
Returned patients call yesterday afternoon. Advised Medicare does not do pre-authorization/pre-determinations prior to surgery only after.  They have criterias that need to be met in order for them to pay.  One of them are PT for 6 weeks. She is scheduled for her first visit with [facility] on 08/06/2021. She is to call us when she is finished and we will have her come in and see one of the PA. She is also inquiring about when to come off the plaquenil. I will ask Matt and call her back. ?

## 2021-08-01 NOTE — Therapy (Signed)
?OUTPATIENT PHYSICAL THERAPY CERVICAL EVALUATION ? ? ?Patient Name: Cynthia Strickland ?MRN: [id number] ?DOB:1950/09/02, 71 y.o., female ?Today's Date: 08/06/2021 ? ? PT End of Session - 08/06/21 1250   ? ? Visit Number 1   ? Number of Visits 7   ? Date for PT Re-Evaluation 09/17/21   ? Authorization Type [employer] MCR 6th visit FOTO 10th visit progress note   ? PT Start Time 1145   ? PT Stop Time 1234   ? PT Time Calculation (min) 49 min   ? Activity Tolerance Patient tolerated treatment well   ? Behavior During Therapy [facility] for tasks assessed/performed   ? ?  ?  ? ?  ? ? ?Past Medical History:  ?Diagnosis Date  ? Allergy   ? seasonal   ? Anxiety   ? Barretts esophagus   ? Cataract   ? GERD (gastroesophageal reflux disease)   ? Hiatal hernia   ? Hyperlipidemia   ? Hypertension   ? white coat syndrome  ? Migraine headache with aura   ? Mitral regurgitation 05-19-11  ?  and Tricuspid Regurgitation(had Echo w/Bubble study)for migraines  ? Rheumatoid arthritis ([place]) 12/2012  ? Sleep apnea   ? 4/21-not using cpap, getting fitted for mouthguard  ? ?Past Surgical History:  ?Procedure Laterality Date  ? [place]  ? COLONOSCOPY  2011  ? LAPAROSCOPIC OOPHORECTOMY  2005  ? UPPER GASTROINTESTINAL ENDOSCOPY    ? VAGINAL HYSTERECTOMY  1995  ? ?Patient Active Problem List  ? Diagnosis Date Noted  ? Back pain 07/12/2021  ? Symptomatic mammary hypertrophy 07/12/2021  ? Aphasia 01/13/2019  ? White matter abnormality on MRI of brain 01/13/2019  ? Rheumatoid arthritis ([place]) 08/30/2018  ? Neck pain 08/30/2018  ? GERD (gastroesophageal reflux disease) 12/05/2015  ? PFO (patent foramen ovale) 07/29/2011  ? BARRETTS ESOPHAGUS 07/06/2009  ? UNSPECIFIED CONSTIPATION 06/04/2009  ? HYPERLIPIDEMIA 05/31/2009  ? Anxiety 05/31/2009  ? Complicated migraine 27/05/5007  ? ABDOMINAL BLOATING 05/31/2009  ? PURE HYPERCHOLESTEROLEMIA 05/09/2009  ? CHEST PAIN 05/09/2009  ? ? ?PCP: Karleen Hampshire MD ? ?REFERRING PROVIDER: Wallace Going  DO ? ?REFERRING DIAG:  ?M54.6,G89.29 (ICD-10-CM) - Chronic bilateral thoracic back pain  ?M54.2 (ICD-10-CM) - Neck pain  ?N62 (ICD-10-CM) - Symptomatic mammary hypertrophy  ? ? ?THERAPY DIAG:  ?Cervicalgia ? ?Pain in thoracic spine ? ?Muscle weakness (generalized) ? ?Abnormal posture ? ?ONSET DATE:  Upper back Off and on for past 10 years and then neck pain increasing in last 6 months ? ?SUBJECTIVE:                                                                                                                                                                                                        ? ?  SUBJECTIVE STATEMENT: ?I have been dealing with back and neck pain for a long time. Most recently my neck pain has been increasing in last 6 months.  I am hoping to get stronger to help with pain and hopefully get a breast reduction to alleviate ongoing issues.  Right now I am not able to exercise as much as I would like. I try to walk but my feet bother me.  ?My breasts bother me when I try to take aerobics or zumba classes.   I have to buy larger clothes in order to fit with my shirts.  And I never have a good bra  fit for my large breasts ( as pt sits slumped to get  support for upper back) ? ?PERTINENT HISTORY:  ?Rheumatoid arthritis for past 8-10 years. Barretts esophagus hx , GERD, hiatal hernia, Hyperlipidemia HTN, migraines , mitral regurgitation, Sleep apnea ? ?PAIN:  ?Are you having pain? Yes: NPRS scale: neck 6/10 resting at worst it is 8/10 upper back at rest 6/10 or 9/10 ?Pain location: upper traps bil and upper back ?Pain description: throbbing ?Aggravating factors: my upper back hurts the worst when I am standing and washing dishes or standing for longer than 15 min ?Relieving factors: nothing ?Pt also has problems driving with neck pain and turning.  ?PRECAUTIONS: None ? ?WEIGHT BEARING RESTRICTIONS No ? ?FALLS:  ?Has patient fallen in last 6 months? No ? ?LIVING ENVIRONMENT: ?Lives with: lives with their  spouse ?Lives in: House/apartment ?Stairs: Yes: Internal: 12 steps; on left going up and External: 1 steps; none ?Has following equipment at home: None ? ?OCCUPATION: retired  ? ?PLOF: Independent ? ?PATIENT GOALS To strengthen upper back and reduce pain ? ?OBJECTIVE:  ? ?DIAGNOSTIC FINDINGS:  ?None recent for thoracic/neck ? ?PATIENT SURVEYS:  ?FOTO 38% predicted 54% ? ? ?COGNITION: ?Overall cognitive status: Within functional limits for tasks assessed ? ? ?SENSATION: ?WFL ? ?POSTURE:  ?Forward head and kyphosis. Flexed forward posture ?Excessive breast tissue ? ?PALPATION: ?Hypomobility of spine , tightness over bil upper trap/levator and rhomboids  ? ?CERVICAL ROM:  ? ?Active ROM A/PROM (deg) ?08/06/2021  ?Flexion 46 flexion pain  ?Extension 38  ?Right lateral flexion 38  ?Left lateral flexion 36  ?Right rotation 50  ?Left rotation 17mld pain  ? (Blank rows = not tested) ? ?UE ROM:      bil UE WNL in all but noted below ? ?Active ROM Right ?08/06/2021 Left ?08/06/2021  ?Shoulder flexion    ?Shoulder extension    ?Shoulder abduction    ?Shoulder adduction    ?Shoulder extension    ?Shoulder internal rotation L-1 L-1  ?Shoulder external rotation    ?Elbow flexion    ?Elbow extension    ?Wrist flexion    ?Wrist extension    ?Wrist ulnar deviation    ?Wrist radial deviation    ?Wrist pronation    ?Wrist supination    ? (Blank rows = not tested) ? ?UE MMT: ? ?MMT Right ?08/06/2021 Left ?08/06/2021  ?Shoulder flexion 4+ 4+  ?Shoulder extension    ?Shoulder abduction 4+ 4+  ?Shoulder adduction    ?Shoulder internal rotation 4 4  ?Shoulder external rotation 4 4  ?Middle trapezius    ?Lower trapezius 4- 4-  ?Elbow flexion    ?Elbow extension    ?Grip strength 40.6 40.6  ? (Blank rows = not tested) ? ?CERVICAL SPECIAL TESTS:  ?Neck flexor muscle endurance test: Positive, Upper limb tension test (ULTT): Negative, and Spurling's test:  Negative ? ?Neck flexor muscle endurance 8 sec ( normal 35 sec) ?FUNCTIONAL TESTS:  ?5 times sit to  stand: 9.69 sec   normal 13 sec or less ? ? ? ?TODAY'S TREATMENT:  ?Issued HEP and initial Posture ? ? ?PATIENT EDUCATION:  ?Education details: POC, explanation of findings, initial HEP , posture and FOTO reports ?Person educated: Patient ?Education method: Explanation, Demonstration, Tactile cues, Verbal cues, and Handouts ?Education comprehension: verbalized understanding, returned demonstration, verbal cues required, tactile cues required, and needs further education ? ? ?HOME EXERCISE PROGRAM: ?Access Code: [email or web address] ?URL: [email or web address] ?Date: 08/06/2021 ?Prepared by: Voncille Lo ? ?Exercises ?- Sidelying Thoracic and Shoulder Rotation  - 2 x daily - 7 x weekly - 5-10 reps - 5 hold ?- Cat-Camel to Child's Pose  - 2 x daily - 7 x weekly - 07-1008 reps - 5 hold ?- Supine Deep Neck Flexor Training  - 2-3 x daily - 7 x weekly - 10 reps - 3 hold ?- Seated Gentle Upper Trapezius Stretch  - 1 x daily - 7 x weekly - 1 sets - 3 reps - 30 hold ?- Gentle Levator Scapulae Stretch  - 1 x daily - 7 x weekly - 3 sets - 3 reps - 30 hold ? ?ASSESSMENT: ? ?CLINICAL IMPRESSION: ?Patient is a 71  y.o. female  who was seen today for physical therapy evaluation and treatment for symptomatic mammary hypertrophy, neck pain and Upper thoracic pain. Pt has back pain while standing for 15 min and is not able to exercise with aerobics/zumba due to her excessive breast tissue. She also tends to slump in chair for upper back support during eval. Pt has difficulty finding well fitting tops due to excessive breast tissue. Pt with some UE /upper back weakness.  Pt will benefit from skilled PT to address impairments/body mechanics and provide HEP for home use ? ? ?OBJECTIVE IMPAIRMENTS decreased ROM, decreased strength, hypomobility, improper body mechanics, postural dysfunction, obesity, and pain.  ? ?ACTIVITY LIMITATIONS cleaning, driving, meal prep, laundry, and yard work.  ? ?PERSONAL FACTORS  Rheumatoid Arthritis   are also affecting patient's functional outcome.  ? ? ?REHAB POTENTIAL: Good ? ?CLINICAL DECISION MAKING: Stable/uncomplicated ? ?EVALUATION COMPLEXITY: Low ? ? ?GOALS: ?Goals reviewed with patient? Yes ? ?SHORT T

## 2021-08-06 ENCOUNTER — Ambulatory Visit: Payer: Medicare Other | Attending: Plastic Surgery | Admitting: Physical Therapy

## 2021-08-06 ENCOUNTER — Encounter: Payer: Self-pay | Admitting: Physical Therapy

## 2021-08-06 DIAGNOSIS — G8929 Other chronic pain: Secondary | ICD-10-CM | POA: Diagnosis not present

## 2021-08-06 DIAGNOSIS — N62 Hypertrophy of breast: Secondary | ICD-10-CM | POA: Diagnosis not present

## 2021-08-06 DIAGNOSIS — M546 Pain in thoracic spine: Secondary | ICD-10-CM | POA: Diagnosis present

## 2021-08-06 DIAGNOSIS — R293 Abnormal posture: Secondary | ICD-10-CM | POA: Diagnosis present

## 2021-08-06 DIAGNOSIS — M6281 Muscle weakness (generalized): Secondary | ICD-10-CM | POA: Insufficient documentation

## 2021-08-06 DIAGNOSIS — M542 Cervicalgia: Secondary | ICD-10-CM | POA: Insufficient documentation

## 2021-08-06 NOTE — Patient Instructions (Signed)

## 2021-08-14 ENCOUNTER — Encounter: Payer: Self-pay | Admitting: Physical Therapy

## 2021-08-14 ENCOUNTER — Ambulatory Visit: Payer: Medicare Other | Admitting: Physical Therapy

## 2021-08-14 DIAGNOSIS — R293 Abnormal posture: Secondary | ICD-10-CM

## 2021-08-14 DIAGNOSIS — M542 Cervicalgia: Secondary | ICD-10-CM | POA: Diagnosis not present

## 2021-08-14 DIAGNOSIS — M6281 Muscle weakness (generalized): Secondary | ICD-10-CM

## 2021-08-14 DIAGNOSIS — M546 Pain in thoracic spine: Secondary | ICD-10-CM

## 2021-08-14 NOTE — Therapy (Signed)
?OUTPATIENT PHYSICAL THERAPY TREATMENT NOTE ? ? ?Patient Name: Cynthia Strickland ?MRN: [id number] ?DOB:[birth year], 71 y.o., female ?Today's Date: 08/14/2021 ?PCP: Karleen Hampshire MD ?  ?REFERRING PROVIDER: Wallace Going DO ? ?END OF SESSION:  ? PT End of Session - 08/14/21 0849   ? ? Visit Number 2   ? Number of Visits 7   ? Date for PT Re-Evaluation 09/17/21   ? Authorization Type [employer] MCR 6th visit FOTO 10th visit progress note   ? PT Start Time 0848   ? PT Stop Time 0930   ? PT Time Calculation (min) 42 min   ? ?  ?  ? ?  ? ? ?Past Medical History:  ?Diagnosis Date  ? Allergy   ? seasonal   ? Anxiety   ? Barretts esophagus   ? Cataract   ? GERD (gastroesophageal reflux disease)   ? Hiatal hernia   ? Hyperlipidemia   ? Hypertension   ? white coat syndrome  ? Migraine headache with aura   ? Mitral regurgitation 05-19-11  ?  and Tricuspid Regurgitation(had Echo w/Bubble study)for migraines  ? Rheumatoid arthritis ([place]) 12/2012  ? Sleep apnea   ? 4/21-not using cpap, getting fitted for mouthguard  ? ?Past Surgical History:  ?Procedure Laterality Date  ? [place]  ? COLONOSCOPY  2011  ? LAPAROSCOPIC OOPHORECTOMY  2005  ? UPPER GASTROINTESTINAL ENDOSCOPY    ? VAGINAL HYSTERECTOMY  1995  ? ?Patient Active Problem List  ? Diagnosis Date Noted  ? Back pain 07/12/2021  ? Symptomatic mammary hypertrophy 07/12/2021  ? Aphasia 01/13/2019  ? White matter abnormality on MRI of brain 01/13/2019  ? Rheumatoid arthritis ([place]) 08/30/2018  ? Neck pain 08/30/2018  ? GERD (gastroesophageal reflux disease) 12/05/2015  ? PFO (patent foramen ovale) 07/29/2011  ? BARRETTS ESOPHAGUS 07/06/2009  ? UNSPECIFIED CONSTIPATION 06/04/2009  ? HYPERLIPIDEMIA 05/31/2009  ? Anxiety 05/31/2009  ? Complicated migraine 57/32/2025  ? ABDOMINAL BLOATING 05/31/2009  ? PURE HYPERCHOLESTEROLEMIA 05/09/2009  ? CHEST PAIN 05/09/2009  ? ? ?REFERRING DIAG:  ?M54.6,G89.29 (ICD-10-CM) - Chronic bilateral thoracic back pain  ?M54.2 (ICD-10-CM) - Neck  pain  ?N62 (ICD-10-CM) - Symptomatic mammary hypertrophy  ? ? ?THERAPY DIAG:  ?Cervicalgia ? ?Pain in thoracic spine ? ?Muscle weakness (generalized) ? ?Abnormal posture ? ?PERTINENT HISTORY: Rheumatoid arthritis for past 8-10 years. Barretts esophagus hx , GERD, hiatal hernia, Hyperlipidemia HTN, migraines , mitral regurgitation, Sleep apnea  ? ?PRECAUTIONS: None ? ?SUBJECTIVE: I have been doing the exercises. I hope you make sure I am doing them right.  ? ?PAIN:  ?Are you having pain? Yes: NPRS scale: neck 6/10 resting at worst it is 8/10 upper back at rest 6/10 or 9/10 ?Pain location: upper traps bil and upper back ?Pain description: throbbing ?Aggravating factors: my upper back hurts the worst when I am standing and washing dishes or standing for longer than 15 min ?Relieving factors: nothing ?Pt also has problems driving with neck pain and turning. ? ? ?OBJECTIVE: (objective measures completed at initial evaluation unless otherwise dated) ? ? ?DIAGNOSTIC FINDINGS:  ?None recent for thoracic/neck ?  ?PATIENT SURVEYS:  ?FOTO 38% predicted 54% ?  ?  ?COGNITION: ?Overall cognitive status: Within functional limits for tasks assessed ?  ?  ?SENSATION: ?WFL ?  ?POSTURE:  ?Forward head and kyphosis. Flexed forward posture ?Excessive breast tissue ?  ?PALPATION: ?Hypomobility of spine , tightness over bil upper trap/levator and rhomboids     ?  ?CERVICAL  ROM:  ?  ?Active ROM A/PROM (deg) ?08/06/2021  ?Flexion 46 flexion pain  ?Extension 38  ?Right lateral flexion 38  ?Left lateral flexion 36  ?Right rotation 50  ?Left rotation 2mld pain  ? (Blank rows = not tested) ?  ?UE ROM:      bil UE WNL in all but noted below ?  ?Active ROM Right ?08/06/2021 Left ?08/06/2021  ?Shoulder flexion      ?Shoulder extension      ?Shoulder abduction      ?Shoulder adduction      ?Shoulder extension      ?Shoulder internal rotation L-1 L-1  ?Shoulder external rotation      ?Elbow flexion      ?Elbow extension      ?Wrist flexion      ?Wrist  extension      ?Wrist ulnar deviation      ?Wrist radial deviation      ?Wrist pronation      ?Wrist supination      ? (Blank rows = not tested) ?  ?UE MMT: ?  ?MMT Right ?08/06/2021 Left ?08/06/2021  ?Shoulder flexion 4+ 4+  ?Shoulder extension      ?Shoulder abduction 4+ 4+  ?Shoulder adduction      ?Shoulder internal rotation 4 4  ?Shoulder external rotation 4 4  ?Middle trapezius      ?Lower trapezius 4- 4-  ?Elbow flexion      ?Elbow extension      ?Grip strength 40.6 40.6  ? (Blank rows = not tested) ?  ?CERVICAL SPECIAL TESTS:  ?Neck flexor muscle endurance test: Positive, Upper limb tension test (ULTT): Negative, and Spurling's test: Negative ?  ?Neck flexor muscle endurance 8 sec ( normal 35 sec) ?FUNCTIONAL TESTS:  ?5 times sit to stand: 9.69 sec   normal 13 sec or less ?  ?  ?  ?[facility]  [facility]                                                [facility]: [facility] ?Therapeutic Exercise: ?Review of HEP ?Posture ?CArdine Engpose seated ?Chin tucks seated ?Horizontal abduction red band supine vs seated.  ? ? ?INITIAL TREATMENT: ?Issued HEP and initial Posture ?  ?  ?PATIENT EDUCATION:  ?Education details: POC, explanation of findings, initial HEP , posture and FOTO reports ?Person educated: Patient ?Education method: Explanation, Demonstration, Tactile cues, Verbal cues, and Handouts ?Education comprehension: verbalized understanding, returned demonstration, verbal cues required, tactile cues required, and needs further education ?  ?  ?HOME EXERCISE PROGRAM: ?Access Code: [email or web address]?URL: [email or web address] ?Date: 08/14/2021 ?Prepared by: JHessie Diener? ?Exercises ?- Sidelying Thoracic and Shoulder Rotation  - 2 x daily - 7 x weekly - 5-10 reps - 5 hold ?- Cat-Camel to Child's Pose  - 2 x daily - 7 x weekly - 07-1008 reps - 5 hold ?- Supine Deep Neck Flexor Training  - 2-3 x daily - 7 x weekly - 10 reps - 3 hold ?- Seated Gentle Upper Trapezius Stretch  - 1 x daily - 7 x weekly -  1 sets - 3 reps - 30 hold ?- Gentle Levator Scapulae Stretch  - 1 x daily - 7 x weekly - 3 sets - 3 reps - 30 hold ?Added 08/14/21 ?- Supine Shoulder Horizontal Abduction with Resistance  -  1 x daily - 7 x weekly - 2 sets - 10 reps ?  ?ASSESSMENT: ?  ?CLINICAL IMPRESSION: ?Patient is a 71  y.o. female  who was seen today for physical therapy treatment for symptomatic mammary hypertrophy, neck pain and Upper thoracic pain. She reports compliance with HEP and difficulty with childs pose. She was instructed in options for seated childs pose and seated chin tucks. Additional posture education provided today on the affects of forward head on neck pain. She did well with her HEP today and was able to progress to scap strength in supine. She had some superior shoulder pain if performed in sitting so recommended she perform in supine for now.  Pt will benefit from skilled PT to address impairments/body mechanics and provide HEP for home use.  ?  ?  ?OBJECTIVE IMPAIRMENTS decreased ROM, decreased strength, hypomobility, improper body mechanics, postural dysfunction, obesity, and pain.  ?  ?ACTIVITY LIMITATIONS cleaning, driving, meal prep, laundry, and yard work.  ?  ?PERSONAL FACTORS  Rheumatoid Arthritis  are also affecting patient's functional outcome.  ?  ?  ?REHAB POTENTIAL: Good ?  ?CLINICAL DECISION MAKING: Stable/uncomplicated ?  ?EVALUATION COMPLEXITY: Low ?  ?  ?GOALS: ?Goals reviewed with patient? Yes ?  ?SHORT TERM GOALS: Target date: 08/27/2021 ?  ?Pt will be independent with initial HEP ?Baseline: no knowledge ?Goal status: INITIAL ?  ?2.  Demonstrate understanding of neutral posture and be more conscious of position and posture throughout the day.  ?Baseline: kyphotic posture, forward head ?Goal status: INITIAL ?  ?3.  Report decreased pain by 25% ?Baseline: Pt at rest 6/10 and with standing greater than 15 min 9/10 ?Goal status: INITIAL ?  ?  ?  ?LONG TERM GOALS: Target date: 09/17/2021 ?  ?Pt will be  independent with advanced HEP.  ?Baseline: no knowledge ?Goal status: INITIAL ?  ?2.  Demonstrate and verbalize techniques to reduce the risk of re-injury including: lifting, posture, body mechanics ?Baseline:

## 2021-08-22 ENCOUNTER — Telehealth: Payer: Medicare Other | Admitting: Surgical

## 2021-08-22 ENCOUNTER — Ambulatory Visit: Payer: Medicare Other | Admitting: Physical Therapy

## 2021-08-22 ENCOUNTER — Encounter: Payer: Self-pay | Admitting: Physical Therapy

## 2021-08-22 DIAGNOSIS — M542 Cervicalgia: Secondary | ICD-10-CM

## 2021-08-22 DIAGNOSIS — M6281 Muscle weakness (generalized): Secondary | ICD-10-CM

## 2021-08-22 DIAGNOSIS — M546 Pain in thoracic spine: Secondary | ICD-10-CM

## 2021-08-22 NOTE — Therapy (Signed)
OUTPATIENT PHYSICAL THERAPY TREATMENT NOTE   Patient Name: Cynthia Strickland MRN: [id number] DOB:Apr 24, 1950, 71 y.o., female Today's Date: 08/22/2021 PCP: Karleen Hampshire MD   REFERRING PROVIDER: Wallace Going DO  END OF SESSION:   PT End of Session - 08/22/21 1154     Visit Number 3    Number of Visits 7    Date for PT Re-Evaluation 09/17/21    Authorization Type BC[employer] MCR 6th visit FOTO 10th visit progress note    PT Start Time 1152    PT Stop Time 1230    PT Time Calculation (min) 38 min             Past Medical History:  Diagnosis Date   Allergy    seasonal    Anxiety    Barretts esophagus    Cataract    GERD (gastroesophageal reflux disease)    Hiatal hernia    Hyperlipidemia    Hypertension    white coat syndrome   Migraine headache with aura    Mitral regurgitation 05-19-11    and Tricuspid Regurgitation(had Echo w/Bubble study)for migraines   Rheumatoid arthritis ([place]) 12/2012   Sleep apnea    4/21-not using cpap, getting fitted for mouthguard   Past Surgical History:  Procedure Laterality Date   [place]  2011   LAPAROSCOPIC OOPHORECTOMY  2005   [place]   Patient Active Problem List   Diagnosis Date Noted   Back pain 07/12/2021   Symptomatic mammary hypertrophy 07/12/2021   Aphasia 01/13/2019   White matter abnormality on MRI of brain 01/13/2019   Rheumatoid arthritis ([place]) 08/30/2018   Neck pain 08/30/2018   GERD (gastroesophageal reflux disease) 12/05/2015   PFO (patent foramen ovale) 07/29/2011   BARRETTS ESOPHAGUS 07/06/2009   UNSPECIFIED CONSTIPATION 06/04/2009   HYPERLIPIDEMIA 05/31/2009   Anxiety 18/56/3149   Complicated migraine 70/26/3785   ABDOMINAL BLOATING 05/31/2009   PURE HYPERCHOLESTEROLEMIA 05/09/2009   CHEST PAIN 05/09/2009    REFERRING DIAG:  M54.6,G89.29 (ICD-10-CM) - Chronic bilateral thoracic back pain  M54.2 (ICD-10-CM) - Neck  pain  N62 (ICD-10-CM) - Symptomatic mammary hypertrophy    THERAPY DIAG:  Cervicalgia  Pain in thoracic spine  Muscle weakness (generalized)  PERTINENT HISTORY: Rheumatoid arthritis for past 8-10 years. Barretts esophagus hx , GERD, hiatal hernia, Hyperlipidemia HTN, migraines , mitral regurgitation, Sleep apnea   PRECAUTIONS: None  SUBJECTIVE: I am a little sore in my neck. It hurts more when I stretch all the way to the side.   PAIN:  Are you having pain? Yes: NPRS scale: neck 5/10 resting at worst it is 8/10 upper back at rest 6/10 or 9/10 Pain location: upper traps bil and upper back Pain description: throbbing Aggravating factors: my upper back hurts the worst when I am standing and washing dishes or standing for longer than 15 min Relieving factors: nothing Pt also has problems driving with neck pain and turning.   OBJECTIVE: (objective measures completed at initial evaluation unless otherwise dated)   DIAGNOSTIC FINDINGS:  None recent for thoracic/neck   PATIENT SURVEYS:  FOTO 38% predicted 54%     COGNITION: Overall cognitive status: Within functional limits for tasks assessed     SENSATION: WFL   POSTURE:  Forward head and kyphosis. Flexed forward posture Excessive breast tissue   PALPATION: Hypomobility of spine , tightness over bil upper trap/levator and rhomboids  CERVICAL ROM:    Active ROM A/PROM (deg) 08/06/2021  Flexion 46 flexion pain  Extension 38  Right lateral flexion 38  Left lateral flexion 36  Right rotation 50  Left rotation 65mld pain   (Blank rows = not tested)   UE ROM:      bil UE WNL in all but noted below   Active ROM Right 08/06/2021 Left 08/06/2021  Shoulder flexion      Shoulder extension      Shoulder abduction      Shoulder adduction      Shoulder extension      Shoulder internal rotation L-1 L-1  Shoulder external rotation      Elbow flexion      Elbow extension      Wrist flexion      Wrist extension       Wrist ulnar deviation      Wrist radial deviation      Wrist pronation      Wrist supination       (Blank rows = not tested)   UE MMT:   MMT Right 08/06/2021 Left 08/06/2021  Shoulder flexion 4+ 4+  Shoulder extension      Shoulder abduction 4+ 4+  Shoulder adduction      Shoulder internal rotation 4 4  Shoulder external rotation 4 4  Middle trapezius      Lower trapezius 4- 4-  Elbow flexion      Elbow extension      Grip strength 40.6 40.6   (Blank rows = not tested)   CERVICAL SPECIAL TESTS:  Neck flexor muscle endurance test: Positive, Upper limb tension test (ULTT): Negative, and Spurling's test: Negative   Neck flexor muscle endurance 8 sec ( normal 35 sec) FUNCTIONAL TESTS:  5 times sit to stand: 9.69 sec   normal 13 sec or less       TODAY'S TREATMENT:  [facility] Adult PT Treatment:                                                DATE: 08/22/21 Therapeutic Exercise: Nustep L4 UE/LE x 5 min Standing horizontal abduction and ER red band Row red band  Pec stretch in doorway 10 sec x 4  Open books- hand behind head Bridge x 10 Supine chin tuck 5 sec x 10    [facility] Adult PT Treatment:                                                DATE: 08/14/21 Therapeutic Exercise: Review of HEP Posture Childs pose seated Chin tucks seated Horizontal abduction red band supine vs seated.    INITIAL TREATMENT: Issued HEP and initial Posture     PATIENT EDUCATION:  Education details: POC, explanation of findings, initial HEP , posture and FOTO reports Person educated: Patient Education method: Explanation, Demonstration, Tactile cues, Verbal cues, and Handouts Education comprehension: verbalized understanding, returned demonstration, verbal cues required, tactile cues required, and needs further education     HOME EXERCISE PROGRAM: Access Code: [email or web address] URL: [email or web address] Date: 08/22/2021 Prepared by: JHessie Diener Exercises - Sidelying Thoracic  and Shoulder Rotation  - 2 x daily - 7 x weekly - 5-10 reps - 5  hold - Cat-Camel to Child's Pose  - 2 x daily - 7 x weekly - 07-1008 reps - 5 hold - Supine Deep Neck Flexor Training  - 2-3 x daily - 7 x weekly - 10 reps - 3 hold - Seated Gentle Upper Trapezius Stretch  - 1 x daily - 7 x weekly - 1 sets - 3 reps - 30 hold - Gentle Levator Scapulae Stretch  - 1 x daily - 7 x weekly - 3 sets - 3 reps - 30 hold - Supine Shoulder Horizontal Abduction with Resistance  - 1 x daily - 7 x weekly - 2 sets - 10 reps - Shoulder External Rotation and Scapular Retraction with Resistance  - 1 x daily - 7 x weekly - 2 sets - 10 reps - Doorway Pec Stretch at 90 Degrees Abduction  - 1 x daily - 7 x weekly - 1 sets - 3 reps - 30 hold   ASSESSMENT:   CLINICAL IMPRESSION: Patient is a 71  y.o. female  who was seen today for physical therapy treatment for symptomatic mammary hypertrophy, neck pain and Upper thoracic pain. She reports compliance with HEP and likes the therabands. She reports increased attention to her posture. Added pec stretch and posterior chain strengthening. She did well with the additions and liked the pec stretch.  Pt will benefit from skilled PT to address impairments/body mechanics and provide HEP for home use.      OBJECTIVE IMPAIRMENTS decreased ROM, decreased strength, hypomobility, improper body mechanics, postural dysfunction, obesity, and pain.    ACTIVITY LIMITATIONS cleaning, driving, meal prep, laundry, and yard work.    PERSONAL FACTORS  Rheumatoid Arthritis  are also affecting patient's functional outcome.      REHAB POTENTIAL: Good   CLINICAL DECISION MAKING: Stable/uncomplicated   EVALUATION COMPLEXITY: Low     GOALS: Goals reviewed with patient? Yes   SHORT TERM GOALS: Target date: 08/27/2021   Pt will be independent with initial HEP Baseline: no knowledge Goal status: INITIAL   2.  Demonstrate understanding of neutral posture and be more conscious of position and  posture throughout the day.  Baseline: kyphotic posture, forward head Goal status: INITIAL   3.  Report decreased pain by 25% Baseline: Pt at rest 6/10 and with standing greater than 15 min 9/10 Goal status: INITIAL       LONG TERM GOALS: Target date: 09/17/2021   Pt will be independent with advanced HEP.  Baseline: no knowledge Goal status: INITIAL   2.  Demonstrate and verbalize techniques to reduce the risk of re-injury including: lifting, posture, body mechanics Baseline: limited knowledge Goal status: INITIAL   3.  FOTO will improve from  38%  to   54%  indicating improved functional mobility.  Baseline: 38% eval Goal status: INITIAL   4.  Pt will be able to stand for 30 min to perform household chores with pain no greater than 3/10 Baseline: at rest 6/10 and with movement 9/10 Goal status: INITIAL   5.  Pt will be able to turn head while driving without exacerbating pain in neck Baseline: Pt unable to turn head to R without 9/10 pain Goal status: INITIAL   6.  Pt will be able to incorporate walking for exercise  Baseline: no routine exercise due to upper back and neck pain Goal status: INITIAL     PLAN: PT FREQUENCY: 1x/week   PT DURATION: 6 weeks   PLANNED INTERVENTIONS: Therapeutic exercises, Therapeutic activity, Neuromuscular re-education, Balance training,  Gait training, Patient/Family education, Joint mobilization, Dry Needling, Electrical stimulation, Spinal mobilization, Cryotherapy, Moist heat, Taping, Ionotophoresis '4mg'$ /ml Dexamethasone, and Manual therapy   PLAN FOR NEXT SESSION: REview HEP. Add sub scapular strengthening as needed.  BAsic posture and body mechanics    Hessie Diener, PTA 08/22/21 12:29 PM Phone: [phone] Fax: [phone]

## 2021-08-29 ENCOUNTER — Ambulatory Visit: Payer: Medicare Other | Attending: Plastic Surgery | Admitting: Physical Therapy

## 2021-08-29 ENCOUNTER — Encounter: Payer: Self-pay | Admitting: Physical Therapy

## 2021-08-29 DIAGNOSIS — M542 Cervicalgia: Secondary | ICD-10-CM | POA: Insufficient documentation

## 2021-08-29 DIAGNOSIS — M6281 Muscle weakness (generalized): Secondary | ICD-10-CM | POA: Insufficient documentation

## 2021-08-29 DIAGNOSIS — R293 Abnormal posture: Secondary | ICD-10-CM | POA: Diagnosis present

## 2021-08-29 DIAGNOSIS — M546 Pain in thoracic spine: Secondary | ICD-10-CM | POA: Insufficient documentation

## 2021-08-29 NOTE — Therapy (Signed)
OUTPATIENT PHYSICAL THERAPY TREATMENT NOTE   Patient Name: Cynthia Strickland MRN: [id number] DOB:January 17, 1951, 71 y.o., female Today's Date: 08/29/2021 PCP: Karleen Hampshire MD   REFERRING PROVIDER: Wallace Going DO  END OF SESSION:   PT End of Session - 08/29/21 1155     Visit Number 4    Number of Visits 7    Date for PT Re-Evaluation 09/17/21    Authorization Type [employer]BS MCR 6th visit FOTO 10th visit progress note    PT Start Time 1150    PT Stop Time 1230    PT Time Calculation (min) 40 min             Past Medical History:  Diagnosis Date   Allergy    seasonal    Anxiety    Barretts esophagus    Cataract    GERD (gastroesophageal reflux disease)    Hiatal hernia    Hyperlipidemia    Hypertension    white coat syndrome   Migraine headache with aura    Mitral regurgitation 05-19-11    and Tricuspid Regurgitation(had Echo w/Bubble study)for migraines   Rheumatoid arthritis ([place]) 12/2012   Sleep apnea    4/21-not using cpap, getting fitted for mouthguard   Past Surgical History:  Procedure Laterality Date   [place]  2011   LAPAROSCOPIC OOPHORECTOMY  2005   [place]   Patient Active Problem List   Diagnosis Date Noted   Back pain 07/12/2021   Symptomatic mammary hypertrophy 07/12/2021   Aphasia 01/13/2019   White matter abnormality on MRI of brain 01/13/2019   Rheumatoid arthritis ([place]) 08/30/2018   Neck pain 08/30/2018   GERD (gastroesophageal reflux disease) 12/05/2015   PFO (patent foramen ovale) 07/29/2011   BARRETTS ESOPHAGUS 07/06/2009   UNSPECIFIED CONSTIPATION 06/04/2009   HYPERLIPIDEMIA 05/31/2009   Anxiety 04/02/7251   Complicated migraine 66/44/0347   ABDOMINAL BLOATING 05/31/2009   PURE HYPERCHOLESTEROLEMIA 05/09/2009   CHEST PAIN 05/09/2009    REFERRING DIAG:  M54.6,G89.29 (ICD-10-CM) - Chronic bilateral thoracic back pain  M54.2 (ICD-10-CM) - Neck  pain  N62 (ICD-10-CM) - Symptomatic mammary hypertrophy    THERAPY DIAG:  Cervicalgia  Pain in thoracic spine  Muscle weakness (generalized)  PERTINENT HISTORY: Rheumatoid arthritis for past 8-10 years. Barretts esophagus hx , GERD, hiatal hernia, Hyperlipidemia HTN, migraines , mitral regurgitation, Sleep apnea   PRECAUTIONS: None  SUBJECTIVE:My neck is the worst. The rest of the body is feeling better with the exercises. I have been diagnosed with tendonitis in both ankles.   PAIN:  Are you having pain? Yes: NPRS scale: neck 6/10 with turning head or looking down.  Pain location: upper traps bil and upper back Pain description: throbbing Aggravating factors: my upper back hurts the worst when I am standing and washing dishes or standing for longer than 15 min Relieving factors: stretch Pt also has problems driving with neck pain and turning.   OBJECTIVE: (objective measures completed at initial evaluation unless otherwise dated)   DIAGNOSTIC FINDINGS:  None recent for thoracic/neck   PATIENT SURVEYS:  FOTO 38% predicted 54%     COGNITION: Overall cognitive status: Within functional limits for tasks assessed     SENSATION: WFL   POSTURE:  Forward head and kyphosis. Flexed forward posture Excessive breast tissue   PALPATION: Hypomobility of spine , tightness over bil upper trap/levator and rhomboids       CERVICAL ROM:  Active ROM A/PROM (deg) 08/06/2021 08/29/21 AROM  Flexion 46 flexion pain 50 pain  Extension 38   Right lateral flexion 38 37  Left lateral flexion 36 34  Right rotation 50 60  Left rotation 35mld pain 65   (Blank rows = not tested)   UE ROM:      bil UE WNL in all but noted below   Active ROM Right 08/06/2021 Left 08/06/2021  Shoulder flexion      Shoulder extension      Shoulder abduction      Shoulder adduction      Shoulder extension      Shoulder internal rotation L-1 L-1  Shoulder external rotation      Elbow flexion       Elbow extension      Wrist flexion      Wrist extension      Wrist ulnar deviation      Wrist radial deviation      Wrist pronation      Wrist supination       (Blank rows = not tested)   UE MMT:   MMT Right 08/06/2021 Left 08/06/2021  Shoulder flexion 4+ 4+  Shoulder extension      Shoulder abduction 4+ 4+  Shoulder adduction      Shoulder internal rotation 4 4  Shoulder external rotation 4 4  Middle trapezius      Lower trapezius 4- 4-  Elbow flexion      Elbow extension      Grip strength 40.6 40.6   (Blank rows = not tested)   CERVICAL SPECIAL TESTS:  Neck flexor muscle endurance test: Positive, Upper limb tension test (ULTT): Negative, and Spurling's test: Negative   Neck flexor muscle endurance 8 sec ( normal 35 sec) FUNCTIONAL TESTS:  5 times sit to stand: 9.69 sec   normal 13 sec or less       TODAY'S TREATMENT:  [facility] Adult PT Treatment:                                                DATE: 08/29/21 Therapeutic Exercise: Seated chin tuck Supine head lift 25 sec  Supine head lift 10 sec x 3   Supine Green  horizontal abduction and ER - chin tucked on towel roll Open books- hand behind head- min cues for positioning  Row green band  Pec stretch in doorway 10 sec x 4     [facility] Adult PT Treatment:                                                DATE: 08/22/21 Therapeutic Exercise: Nustep L4 UE/LE x 5 min Standing horizontal abduction and ER red band Row red band  Pec stretch in doorway 10 sec x 4  Open books- hand behind head Bridge x 10 Supine chin tuck 5 sec x 10    [facility] Adult PT Treatment:                                                DATE: 08/14/21 Therapeutic Exercise: Review  of HEP Posture Childs pose seated Chin tucks seated Horizontal abduction red band supine vs seated.    INITIAL TREATMENT: Issued HEP and initial Posture     PATIENT EDUCATION:  Education details: POC, explanation of findings, initial HEP , posture and FOTO  reports Person educated: Patient Education method: Explanation, Demonstration, Tactile cues, Verbal cues, and Handouts Education comprehension: verbalized understanding, returned demonstration, verbal cues required, tactile cues required, and needs further education     HOME EXERCISE PROGRAM: Access Code: [email or web address] URL: [email or web address] Date: 08/22/2021 Prepared by: Hessie Diener  Exercises - Sidelying Thoracic and Shoulder Rotation  - 2 x daily - 7 x weekly - 5-10 reps - 5 hold - Cat-Camel to Child's Pose  - 2 x daily - 7 x weekly - 07-1008 reps - 5 hold - Supine Deep Neck Flexor Training  - 2-3 x daily - 7 x weekly - 10 reps - 3 hold - Seated Gentle Upper Trapezius Stretch  - 1 x daily - 7 x weekly - 1 sets - 3 reps - 30 hold - Gentle Levator Scapulae Stretch  - 1 x daily - 7 x weekly - 3 sets - 3 reps - 30 hold - Supine Shoulder Horizontal Abduction with Resistance  - 1 x daily - 7 x weekly - 2 sets - 10 reps - Shoulder External Rotation and Scapular Retraction with Resistance  - 1 x daily - 7 x weekly - 2 sets - 10 reps - Doorway Pec Stretch at 90 Degrees Abduction  - 1 x daily - 7 x weekly - 1 sets - 3 reps - 30 hold   ASSESSMENT:   CLINICAL IMPRESSION: Patient is a 71  y.o. female  who was seen today for physical therapy treatment for symptomatic mammary hypertrophy, neck pain and Upper thoracic pain. She reports neck pain still bothersome but with improved ROM and stiffness. She was recently diagnosed with bilateral ankle tendonitis. Continued with neck stabilization, thoracic stretching and posterior chain strengthening. Her Neck AROM has improved and with pain during flexion and rotation. She is compliant with her HEP.  Pt will benefit from skilled PT to address impairments/body mechanics and provide HEP for home use.      OBJECTIVE IMPAIRMENTS decreased ROM, decreased strength, hypomobility, improper body mechanics, postural dysfunction, obesity, and pain.     ACTIVITY LIMITATIONS cleaning, driving, meal prep, laundry, and yard work.    PERSONAL FACTORS  Rheumatoid Arthritis  are also affecting patient's functional outcome.      REHAB POTENTIAL: Good   CLINICAL DECISION MAKING: Stable/uncomplicated   EVALUATION COMPLEXITY: Low     GOALS: Goals reviewed with patient? Yes   SHORT TERM GOALS: Target date: 08/27/2021   Pt will be independent with initial HEP Baseline: no knowledge Goal status: MET   2.  Demonstrate understanding of neutral posture and be more conscious of position and posture throughout the day.  Baseline: kyphotic posture, forward head Goal status: MET   3.  Report decreased pain by 25% Baseline: Pt at rest 6/10 and with standing greater than 15 min 9/10 Status 08/29/21: 10% improvement  Goal status: ONGOING       LONG TERM GOALS: Target date: 09/17/2021   Pt will be independent with advanced HEP.  Baseline: no knowledge Goal status: INITIAL   2.  Demonstrate and verbalize techniques to reduce the risk of re-injury including: lifting, posture, body mechanics Baseline: limited knowledge Goal status: INITIAL   3.  FOTO will improve from  38%  to  54%  indicating improved functional mobility.  Baseline: 38% eval Goal status: INITIAL   4.  Pt will be able to stand for 30 min to perform household chores with pain no greater than 3/10 Baseline: at rest 6/10 and with movement 9/10 Goal status: INITIAL   5.  Pt will be able to turn head while driving without exacerbating pain in neck Baseline: Pt unable to turn head to R without 9/10 pain Goal status: INITIAL   6.  Pt will be able to incorporate walking for exercise  Baseline: no routine exercise due to upper back and neck pain Goal status: INITIAL     PLAN: PT FREQUENCY: 1x/week   PT DURATION: 6 weeks   PLANNED INTERVENTIONS: Therapeutic exercises, Therapeutic activity, Neuromuscular re-education, Balance training, Gait training, Patient/Family  education, Joint mobilization, Dry Needling, Electrical stimulation, Spinal mobilization, Cryotherapy, Moist heat, Taping, Ionotophoresis 62m/ml Dexamethasone, and Manual therapy   PLAN FOR NEXT SESSION: REview HEP. Add sub scapular strengthening as needed.  BAsic posture and body mechanics    JHessie Diener PTA 08/29/21 1:35 PM Phone: [phone]: [phone]

## 2021-09-03 ENCOUNTER — Ambulatory Visit: Payer: Medicare Other | Admitting: Physical Therapy

## 2021-09-03 ENCOUNTER — Encounter: Payer: Self-pay | Admitting: Physical Therapy

## 2021-09-03 DIAGNOSIS — M542 Cervicalgia: Secondary | ICD-10-CM | POA: Diagnosis not present

## 2021-09-03 DIAGNOSIS — M6281 Muscle weakness (generalized): Secondary | ICD-10-CM

## 2021-09-03 DIAGNOSIS — M546 Pain in thoracic spine: Secondary | ICD-10-CM

## 2021-09-03 DIAGNOSIS — R293 Abnormal posture: Secondary | ICD-10-CM

## 2021-09-03 NOTE — Therapy (Signed)
OUTPATIENT PHYSICAL THERAPY TREATMENT NOTE   Patient Name: Cynthia Strickland MRN: [id number] DOB:[birth year], 71 y.o., female Today's Date: 09/03/2021 PCP: Karleen Hampshire MD   REFERRING PROVIDER: Wallace Going DO  END OF SESSION:   PT End of Session - 09/03/21 1108     Visit Number 5    Number of Visits 7    Date for PT Re-Evaluation 09/17/21    Authorization Type BCBS MCR 6th visit FOTO 10th visit progress note    PT Start Time 1103    PT Stop Time 1145    PT Time Calculation (min) 42 min             Past Medical History:  Diagnosis Date   Allergy    seasonal    Anxiety    Barretts esophagus    Cataract    GERD (gastroesophageal reflux disease)    Hiatal hernia    Hyperlipidemia    Hypertension    white coat syndrome   Migraine headache with aura    Mitral regurgitation 05-19-11    and Tricuspid Regurgitation(had Echo w/Bubble study)for migraines   Rheumatoid arthritis ([place]) 12/2012   Sleep apnea    4/21-not using cpap, getting fitted for mouthguard   Past Surgical History:  Procedure Laterality Date   [place]  2011   LAPAROSCOPIC OOPHORECTOMY  2005   [place]   Patient Active Problem List   Diagnosis Date Noted   Back pain 07/12/2021   Symptomatic mammary hypertrophy 07/12/2021   Aphasia 01/13/2019   White matter abnormality on MRI of brain 01/13/2019   Rheumatoid arthritis ([place]) 08/30/2018   Neck pain 08/30/2018   GERD (gastroesophageal reflux disease) 12/05/2015   PFO (patent foramen ovale) 07/29/2011   BARRETTS ESOPHAGUS 07/06/2009   UNSPECIFIED CONSTIPATION 06/04/2009   HYPERLIPIDEMIA 05/31/2009   Anxiety 77/82/4235   Complicated migraine 36/14/4315   ABDOMINAL BLOATING 05/31/2009   PURE HYPERCHOLESTEROLEMIA 05/09/2009   CHEST PAIN 05/09/2009    REFERRING DIAG:  M54.6,G89.29 (ICD-10-CM) - Chronic bilateral thoracic back pain  M54.2 (ICD-10-CM) - Neck  pain  N62 (ICD-10-CM) - Symptomatic mammary hypertrophy    THERAPY DIAG:  Cervicalgia  Pain in thoracic spine  Muscle weakness (generalized)  Abnormal posture  PERTINENT HISTORY: Rheumatoid arthritis for past 8-10 years. Barretts esophagus hx , GERD, hiatal hernia, Hyperlipidemia HTN, migraines , mitral regurgitation, Sleep apnea   PRECAUTIONS: None  SUBJECTIVE:My neck has been better the last few days. The exercises make it better. I have an ankle brace for my right foot.  PAIN:  Are you having pain? Yes: NPRS scale: neck 5/10 with turning head or looking down.  Pain location: upper traps bil and upper back Pain description: throbbing Aggravating factors: my upper back hurts the worst when I am standing and washing dishes or standing for longer than 15 min Relieving factors: stretch Pt also has problems driving with neck pain and turning.   OBJECTIVE: (objective measures completed at initial evaluation unless otherwise dated)   DIAGNOSTIC FINDINGS:  None recent for thoracic/neck   PATIENT SURVEYS:  FOTO 38% predicted 54%     COGNITION: Overall cognitive status: Within functional limits for tasks assessed     SENSATION: WFL   POSTURE:  Forward head and kyphosis. Flexed forward posture Excessive breast tissue   PALPATION: Hypomobility of spine , tightness over bil upper trap/levator and rhomboids       CERVICAL ROM:  Active ROM A/PROM (deg) 08/06/2021 08/29/21 AROM  Flexion 46 flexion pain 50 pain  Extension 38   Right lateral flexion 38 37  Left lateral flexion 36 34  Right rotation 50 60  Left rotation 60mld pain 65   (Blank rows = not tested)   UE ROM:      bil UE WNL in all but noted below   Active ROM Right 08/06/2021 Left 08/06/2021  Shoulder flexion      Shoulder extension      Shoulder abduction      Shoulder adduction      Shoulder extension      Shoulder internal rotation L-1 L-1  Shoulder external rotation      Elbow flexion       Elbow extension      Wrist flexion      Wrist extension      Wrist ulnar deviation      Wrist radial deviation      Wrist pronation      Wrist supination       (Blank rows = not tested)   UE MMT:   MMT Right 08/06/2021 Left 08/06/2021   Shoulder flexion 4+ 4+   Shoulder extension       Shoulder abduction 4+ 4+   Shoulder adduction       Shoulder internal rotation 4 4 4+/5 bilat  Shoulder external rotation 4 4 5/5 bilat   Middle trapezius       Lower trapezius 4- 4-   Elbow flexion       Elbow extension       Grip strength 40.6 40.6    (Blank rows = not tested)   CERVICAL SPECIAL TESTS:  Neck flexor muscle endurance test: Positive, Upper limb tension test (ULTT): Negative, and Spurling's test: Negative   Neck flexor muscle endurance 8 sec ( normal 35 sec) FUNCTIONAL TESTS:  5 times sit to stand: 9.69 sec   normal 13 sec or less       TODAY'S TREATMENT:  [facility] Adult PT Treatment:                                                DATE: 09/03/21 Therapeutic Exercise: Nustep L5 UE/LE x 5 minutes  Pec stretch in doorway Standing row green band  Standing shoulder ext green band  Bridge x 15 Open books- hand behind head Supine star pattern blue band x 15    [facility] Adult PT Treatment:                                                DATE: 08/29/21 Therapeutic Exercise: Seated chin tuck Supine head lift 25 sec  Supine head lift 10 sec x 3   Supine Green  horizontal abduction and ER - chin tucked on towel roll Open books- hand behind head- min cues for positioning  Row green band  Pec stretch in doorway 10 sec x 4     [facility] Adult PT Treatment:  DATE: 08/22/21 Therapeutic Exercise: Nustep L4 UE/LE x 5 min Standing horizontal abduction and ER red band Row red band  Pec stretch in doorway 10 sec x 4  Open books- hand behind head Bridge x 10 Supine chin tuck 5 sec x 10    [facility] Adult PT Treatment:                                                 DATE: 08/14/21 Therapeutic Exercise: Review of HEP Posture Childs pose seated Chin tucks seated Horizontal abduction red band supine vs seated.    INITIAL TREATMENT: Issued HEP and initial Posture     PATIENT EDUCATION:  Education details: POC, explanation of findings, initial HEP , posture and FOTO reports Person educated: Patient Education method: Explanation, Demonstration, Tactile cues, Verbal cues, and Handouts Education comprehension: verbalized understanding, returned demonstration, verbal cues required, tactile cues required, and needs further education     HOME EXERCISE PROGRAM: Access Code: [email or web address] URL: [email or web address] Date: 08/22/2021 Prepared by: Hessie Diener  Exercises - Sidelying Thoracic and Shoulder Rotation  - 2 x daily - 7 x weekly - 5-10 reps - 5 hold - Cat-Camel to Child's Pose  - 2 x daily - 7 x weekly - 07-1008 reps - 5 hold - Supine Deep Neck Flexor Training  - 2-3 x daily - 7 x weekly - 10 reps - 3 hold - Seated Gentle Upper Trapezius Stretch  - 1 x daily - 7 x weekly - 1 sets - 3 reps - 30 hold - Gentle Levator Scapulae Stretch  - 1 x daily - 7 x weekly - 3 sets - 3 reps - 30 hold - Supine Shoulder Horizontal Abduction with Resistance  - 1 x daily - 7 x weekly - 2 sets - 10 reps - Shoulder External Rotation and Scapular Retraction with Resistance  - 1 x daily - 7 x weekly - 2 sets - 10 reps - Doorway Pec Stretch at 90 Degrees Abduction  - 1 x daily - 7 x weekly - 1 sets - 3 reps - 30 hold   ASSESSMENT:   CLINICAL IMPRESSION: Patient is a 71  y.o. female  who was seen today for physical therapy treatment for symptomatic mammary hypertrophy, neck pain and Upper thoracic pain. She reports neck pain still bothersome but improved the last few days. She reports pain with driving is not as bad. She reports min improvement in pain with household chores. She still has pain with cooking and washing dishes in her upper back. Her  Shoulder strength has improved. Continued with neck stabilization, thoracic stretching and posterior chain strengthening.   Pt will benefit from skilled PT to address impairments/body mechanics and provide HEP for home use. She has a new referral for ankle tendonitis to begin after she completes this episode of care.      OBJECTIVE IMPAIRMENTS decreased ROM, decreased strength, hypomobility, improper body mechanics, postural dysfunction, obesity, and pain.    ACTIVITY LIMITATIONS cleaning, driving, meal prep, laundry, and yard work.    PERSONAL FACTORS  Rheumatoid Arthritis  are also affecting patient's functional outcome.      REHAB POTENTIAL: Good   CLINICAL DECISION MAKING: Stable/uncomplicated   EVALUATION COMPLEXITY: Low     GOALS: Goals reviewed with patient? Yes   SHORT TERM GOALS: Target date: 08/27/2021   Pt will be independent with  initial HEP Baseline: no knowledge Goal status: MET   2.  Demonstrate understanding of neutral posture and be more conscious of position and posture throughout the day.  Baseline: kyphotic posture, forward head Goal status: MET   3.  Report decreased pain by 25% Baseline: Pt at rest 6/10 and with standing greater than 15 min 9/10 Status 08/29/21: 10% improvement  Goal status: ONGOING       LONG TERM GOALS: Target date: 09/17/2021   Pt will be independent with advanced HEP.  Baseline: no knowledge Goal status: ONGOING   2.  Demonstrate and verbalize techniques to reduce the risk of re-injury including: lifting, posture, body mechanics Baseline: limited knowledge Goal status: ONGOING   3.  FOTO will improve from  38%  to   54%  indicating improved functional mobility.  Baseline: 38% eval Goal status: ONGOING   4.  Pt will be able to stand for 30 min to perform household chores with pain no greater than 3/10 Baseline: at rest 6/10 and with movement 9/10 Status: 09/03/21: min improvement  Goal status: ONGOING  5.  Pt will be able to  turn head while driving without exacerbating pain in neck Baseline: Pt unable to turn head to R without 9/10 pain Status:09/03/24:min improvement  Goal statusONGOING   6.  Pt will be able to incorporate walking for exercise  Baseline: no routine exercise due to upper back and neck pain Status: 09/03/21: ankle pain is limiting walking  Goal status: ONGOING     PLAN: PT FREQUENCY: 1x/week   PT DURATION: 6 weeks   PLANNED INTERVENTIONS: Therapeutic exercises, Therapeutic activity, Neuromuscular re-education, Balance training, Gait training, Patient/Family education, Joint mobilization, Dry Needling, Electrical stimulation, Spinal mobilization, Cryotherapy, Moist heat, Taping, Ionotophoresis 10m/ml Dexamethasone, and Manual therapy   PLAN FOR NEXT SESSION: REview HEP. Add sub scapular strengthening as needed.  BAsic posture and body mechanics, FOTO status    JHessie Diener PTA 09/03/21 2:13 PM Phone: [phone]: [phone]

## 2021-09-09 ENCOUNTER — Telehealth: Payer: Self-pay

## 2021-09-09 NOTE — Therapy (Signed)
OUTPATIENT PHYSICAL THERAPY TREATMENT NOTE/DISCHARGE NOTE   Patient Name: DORISSA STINNETTE MRN: [id number] DOB:[birth year], 71 y.o., female Today's Date: 09/10/2021 PCP: Karleen Hampshire MD  PHYSICAL THERAPY DISCHARGE SUMMARY  Visits from Start of Care: 6  Current functional level related to goals / functional outcomes: As indicated below    Remaining deficits: Pain continues at 4-5/10 in upper back and neck due to excessive breast tissue although pain level has improved with exericse   Education / Equipment: HEP   Patient agrees to discharge. Patient goals were partially met. Patient is being discharged due to being pleased with the current functional level. And mostly meeting and partially meeting rehab goals. Will return to MD for further evaluation of mammary breast hypertrophy   REFERRING PROVIDER: Wallace Going DO  END OF SESSION:   PT End of Session - 09/10/21 1109     Visit Number 6    Number of Visits 7    Date for PT Re-Evaluation 09/17/21    Authorization Type BCBS MCR 6th visit FOTO 10th visit progress note    PT Start Time 1106    PT Stop Time 1145    PT Time Calculation (min) 39 min    Activity Tolerance Patient tolerated treatment well    Behavior During Therapy WFL for tasks assessed/performed              Past Medical History:  Diagnosis Date   Allergy    seasonal    Anxiety    Barretts esophagus    Cataract    GERD (gastroesophageal reflux disease)    Hiatal hernia    Hyperlipidemia    Hypertension    white coat syndrome   Migraine headache with aura    Mitral regurgitation 05-19-11    and Tricuspid Regurgitation(had Echo w/Bubble study)for migraines   Rheumatoid arthritis ([place]) 12/2012   Sleep apnea    4/21-not using cpap, getting fitted for mouthguard   Past Surgical History:  Procedure Laterality Date   [place]   COLONOSCOPY  2011   LAPAROSCOPIC OOPHORECTOMY  2005   [place]   Patient Active Problem List   Diagnosis Date Noted   Back pain 07/12/2021   Symptomatic mammary hypertrophy 07/12/2021   Aphasia 01/13/2019   White matter abnormality on MRI of brain 01/13/2019   Rheumatoid arthritis ([place]) 08/30/2018   Neck pain 08/30/2018   GERD (gastroesophageal reflux disease) 12/05/2015   PFO (patent foramen ovale) 07/29/2011   BARRETTS ESOPHAGUS 07/06/2009   UNSPECIFIED CONSTIPATION 06/04/2009   HYPERLIPIDEMIA 05/31/2009   Anxiety 86/57/8469   Complicated migraine 62/95/2841   ABDOMINAL BLOATING 05/31/2009   PURE HYPERCHOLESTEROLEMIA 05/09/2009   CHEST PAIN 05/09/2009    REFERRING DIAG:  M54.6,G89.29 (ICD-10-CM) - Chronic bilateral thoracic back pain  M54.2 (ICD-10-CM) - Neck pain  N62 (ICD-10-CM) - Symptomatic mammary hypertrophy    THERAPY DIAG:  Cervicalgia  Pain in thoracic spine  Muscle weakness (generalized)  Abnormal posture  PERTINENT HISTORY: Rheumatoid arthritis for past 8-10 years. Barretts esophagus hx , GERD, hiatal hernia, Hyperlipidemia HTN, migraines , mitral regurgitation, Sleep apnea   PRECAUTIONS: None  SUBJECTIVE:My neck has been better the last few days. The exercises make it better. I don't feel as stiff in my neck as I did before I started.  I feel like I can use  my exercise on my own PAIN:  Are you having pain? Yes: NPRS scale: neck 5/10 with  turning head or looking down.  Pain location: upper traps bil and upper back Pain description: throbbing Aggravating factors: my upper back hurts the worst when I am standing and washing dishes or standing for longer than 15 min Relieving factors: stretch Pt also has problems driving with neck pain and turning.   OBJECTIVE: (objective measures completed at initial evaluation unless otherwise dated)   DIAGNOSTIC FINDINGS:  None recent for thoracic/neck   PATIENT SURVEYS:  FOTO 38% predicted 54%     COGNITION: Overall cognitive status: Within  functional limits for tasks assessed     SENSATION: WFL   POSTURE:  Forward head and kyphosis. Flexed forward posture Excessive breast tissue   PALPATION: Hypomobility of spine , tightness over bil upper trap/levator and rhomboids       CERVICAL ROM:    Active ROM A/PROM (deg) 08/06/2021 08/29/21 AROM 09-10-21 AROM  Flexion 46 flexion pain 50 pain 55  Extension 38  40  Right lateral flexion 38 37 38  Left lateral flexion 36 34 36  Right rotation 50 60 60  Left rotation 80mild pain 65 65   (Blank rows = not tested)   UE ROM:      bil UE WNL  for 09-10-21   Active ROM Right 08/06/2021 Left 08/06/2021  Shoulder flexion      Shoulder extension      Shoulder abduction      Shoulder adduction      Shoulder extension      Shoulder internal rotation L-1 L-1  Shoulder external rotation      Elbow flexion      Elbow extension      Wrist flexion      Wrist extension      Wrist ulnar deviation      Wrist radial deviation      Wrist pronation      Wrist supination       (Blank rows = not tested)   UE MMT:   MMT Right 08/06/2021 Left 08/06/2021 RIGHT/LEFT BIL 09-10-21  Shoulder flexion 4+ 4+   Shoulder extension       Shoulder abduction 4+ 4+   Shoulder adduction       Shoulder internal rotation 4 4 4+/5 bilat  Shoulder external rotation 4 4 5/5 bilat   Middle trapezius       Lower trapezius 4- 4-   Elbow flexion       Elbow extension       Grip strength 40.6 40.6    (Blank rows = not tested)   CERVICAL SPECIAL TESTS:  Neck flexor muscle endurance test: Positive, Upper limb tension test (ULTT): Negative, and Spurling's test: Negative   Neck flexor muscle endurance 8 sec ( normal 35 sec)  09-10-21  25 sec FUNCTIONAL TESTS:  5 times sit to stand: 9.69 sec   normal 13 sec or less       TODAY'S TREATMENT:   [facility] Adult PT Treatment:                                                DATE: 09-10-21  Therapeutic Exercise: Thoracic stretch over foam roller Prone T bil with 1  lb weights 2 x 10  with neutral neck Attempted superman but elevates traps Pec stretch in doorway Standing row green band  Standing shoulder ext green band  Supine  star pattern blue band x 15  Self Care: Community wellness  Progressive loading for strength  [facility] Adult PT Treatment:                                                DATE: 09/03/21 Therapeutic Exercise: Nustep L5 UE/LE x 5 minutes  Pec stretch in doorway Standing row green band  Standing shoulder ext green band  Bridge x 15 Open books- hand behind head Supine star pattern blue band x 15    [facility] Adult PT Treatment:                                                DATE: 08/29/21 Therapeutic Exercise: Seated chin tuck Supine head lift 25 sec  Supine head lift 10 sec x 3   Supine Green  horizontal abduction and ER - chin tucked on towel roll Open books- hand behind head- min cues for positioning  Row green band  Pec stretch in doorway 10 sec x 4     [facility] Adult PT Treatment:                                                DATE: 08/22/21 Therapeutic Exercise: Nustep L4 UE/LE x 5 min Standing horizontal abduction and ER red band Row red band  Pec stretch in doorway 10 sec x 4  Open books- hand behind head Bridge x 10 Supine chin tuck 5 sec x 10    [facility] Adult PT Treatment:                                                DATE: 08/14/21 Therapeutic Exercise: Review of HEP Posture Childs pose seated Chin tucks seated Horizontal abduction red band supine vs seated.    INITIAL TREATMENT: Issued HEP and initial Posture     PATIENT EDUCATION:  Education details: POC, explanation of findings, initial HEP , posture and FOTO reports Person educated: Patient Education method: Explanation, Demonstration, Tactile cues, Verbal cues, and Handouts Education comprehension: verbalized understanding, returned demonstration, verbal cues required, tactile cues required, and needs further education     HOME EXERCISE  PROGRAM: Access Code: [email or web address] URL: [email or web address] Date: 09/10/2021 Prepared by: Voncille Lo  Exercises - Sidelying Thoracic and Shoulder Rotation  - 2 x daily - 7 x weekly - 5-10 reps - 5 hold - Cat-Camel to Child's Pose  - 2 x daily - 7 x weekly - 07-1008 reps - 5 hold - Supine Deep Neck Flexor Training  - 2-3 x daily - 7 x weekly - 10 reps - 3 hold - Seated Gentle Upper Trapezius Stretch  - 1 x daily - 7 x weekly - 1 sets - 3 reps - 30 hold - Gentle Levator Scapulae Stretch  - 1 x daily - 7 x weekly - 3 sets - 3 reps - 30 hold - Supine Shoulder Horizontal Abduction with Resistance  - 1 x daily - 7 x weekly -  2 sets - 10 reps - Shoulder External Rotation and Scapular Retraction with Resistance  - 1 x daily - 7 x weekly - 2 sets - 10 reps - Doorway Pec Stretch at 90 Degrees Abduction  - 1 x daily - 7 x weekly - 1 sets - 3 reps - 30 hold - Standing Shoulder Horizontal Abduction with Resistance  - 1 x daily - 7 x weekly - 3 sets - 10 reps - Standing Shoulder Diagonal Horizontal Abduction 60/120 Degrees with Resistance  - 1 x daily - 7 x weekly - 3 sets - 10 reps - Prone Shoulder Horizontal Abduction with External Rotation  - 1 x daily - 7 x weekly - 3 sets - 10 reps - Prone Shoulder Horizontal Abduction with Internal Rotation  - 1 x daily - 7 x weekly - 3 sets - 10 reps - Thoracic Extension Mobilization on Foam Roll  - 1 x daily - 7 x weekly - 3 sets - 10 reps ASSESSMENT:   CLINICAL IMPRESSION: Patient is a 71  y.o. female  who was seen today for physical therapy treatment for symptomatic mammary hypertrophy, neck pain and Upper thoracic pain. She reports neck pain remains at 4-5/10 although pain is helped with HEP.  Pt continues to have pain in upper back and neck due to excessive breast tissue.   She reports min improvement in pain with household chores. She still has pain with cooking and washing dishes in her upper back. Her Shoulder strength has improved as well as  AROM of neck. Pt is now able to maintain neck static flexion for 25 sec from 8 sec on eval noting increased functional strength. Continued with neck stabilization, thoracic stretching and posterior chain strengthening.   Pt is pleased with HEP and shows independence to continue at home independently.  Will DC due to mostly meeting and partially meeting rehab goals.     OBJECTIVE IMPAIRMENTS decreased ROM, decreased strength, hypomobility, improper body mechanics, postural dysfunction, obesity, and pain.    ACTIVITY LIMITATIONS cleaning, driving, meal prep, laundry, and yard work.    PERSONAL FACTORS  Rheumatoid Arthritis  are also affecting patient's functional outcome.      REHAB POTENTIAL: Good   CLINICAL DECISION MAKING: Stable/uncomplicated   EVALUATION COMPLEXITY: Low     GOALS: Goals reviewed with patient? Yes   SHORT TERM GOALS: Target date: 08/27/2021   Pt will be independent with initial HEP Baseline: no knowledge Goal status: MET   2.  Demonstrate understanding of neutral posture and be more conscious of position and posture throughout the day.  Baseline: kyphotic posture, forward head Goal status: MET   3.  Report decreased pain by 25% Baseline: Pt at rest 6/10 and with standing greater than 15 min 9/10 Status 08/29/21: 10% improvement  09-10-21 can be as low as 4/10 but no lower Goal status: MET       LONG TERM GOALS: Target date: 09/17/2021   Pt will be independent with advanced HEP.  Baseline: no knowledge Goal status: MET   2.  Demonstrate and verbalize techniques to reduce the risk of re-injury including: lifting, posture, body mechanics Baseline: limited knowledge Goal status: MET   3.  FOTO will improve from  38%  to   54%  indicating improved functional mobility.  Baseline: 38% eval 09-10-21  63% Goal status: MET   4.  Pt will be able to stand for 30 min to perform household chores with pain no greater than 3/10 Baseline: at  rest 6/10 and with movement  9/10 Status: 09/03/21: min improvement  09-10-21 pain is always 4-5/10 Goal status: Partially MET  5.  Pt will be able to turn head while driving without exacerbating pain in neck Baseline: Pt unable to turn head to R without 9/10 pain Status:09/03/24:min improvement  improved AROM and pain improved to 4-5/10 from 9/10 pain Goal status  Partially MET   6.  Pt will be able to incorporate walking for exercise  Baseline: no routine exercise due to upper back and neck pain Status: 09/03/21: ankle pain is limiting walking , not presently due to ankle tendonitis pain but can utilize bicycle for exercise Goal status: Partially met      PLAN: PT FREQUENCY: 1x/week   PT DURATION: 6 weeks   PLANNED INTERVENTIONS: Therapeutic exercises, Therapeutic activity, Neuromuscular re-education, Balance training, Gait training, Patient/Family education, Joint mobilization, Dry Needling, Electrical stimulation, Spinal mobilization, Cryotherapy, Moist heat, Taping, Ionotophoresis 6m/ml Dexamethasone, and Manual therapy   PLAN FOR NEXT SESSION: REview HEP. Add sub scapular strengthening as needed.  BAsic posture and body mechanics, FOTO status   LVoncille Lo PT, AUs [facility] Hospital-Ft HuachucaCertified Exercise Expert for the Aging Adult  09/10/21 2:13 PM Phone: [phone]: [phone]

## 2021-09-09 NOTE — Telephone Encounter (Signed)
Called patient to follow up with PT. She has an appointment tomorrow, last one is on 09/17/21. Tele-visit with Matt on 09/24/2021. PT notes are in [place].

## 2021-09-10 ENCOUNTER — Encounter: Payer: Self-pay | Admitting: Physical Therapy

## 2021-09-10 ENCOUNTER — Ambulatory Visit: Payer: Medicare Other | Admitting: Physical Therapy

## 2021-09-10 DIAGNOSIS — M542 Cervicalgia: Secondary | ICD-10-CM

## 2021-09-10 DIAGNOSIS — M6281 Muscle weakness (generalized): Secondary | ICD-10-CM

## 2021-09-10 DIAGNOSIS — R293 Abnormal posture: Secondary | ICD-10-CM

## 2021-09-10 DIAGNOSIS — M546 Pain in thoracic spine: Secondary | ICD-10-CM

## 2021-09-17 ENCOUNTER — Ambulatory Visit: Payer: Medicare Other | Admitting: Physical Therapy

## 2021-09-17 ENCOUNTER — Telehealth: Payer: Self-pay | Admitting: *Deleted

## 2021-09-17 NOTE — Telephone Encounter (Signed)
Ms. Cynthia Strickland called stating she has completed PT and wants to know what her next step is for surgery? Advised that her next step is her scheduled appointment on 6/29 with [facility] and she can discuss further questions with him at that time.

## 2021-09-24 ENCOUNTER — Ambulatory Visit: Payer: Medicare Other | Admitting: Physical Therapy

## 2021-09-26 ENCOUNTER — Telehealth (INDEPENDENT_AMBULATORY_CARE_PROVIDER_SITE_OTHER): Payer: Medicare Other | Admitting: Student

## 2021-09-26 DIAGNOSIS — N62 Hypertrophy of breast: Secondary | ICD-10-CM

## 2021-09-26 DIAGNOSIS — M546 Pain in thoracic spine: Secondary | ICD-10-CM | POA: Diagnosis not present

## 2021-09-26 NOTE — Progress Notes (Addendum)
   Referring Provider Karleen Hampshire., MD [address] [place],  [place] [address]   CC: Follow up     Cynthia Strickland is an 71 y.o. female.  HPI: Patient is a 71 y.o. year old female here for follow up after completing physical therapy for pain related to macromastia.   She was seen for initial consult by Dr. Marla Roe on 07/12/2021.Marland Kitchen  At that time, patient reported she has had mammary hyperplasia for several years.  She complained of symptoms including back pain, neck pain, grooving of her bra straps and activities inhibited by her enlarged breast.  Her STN on the right was found to be 28 cm in the STN on the left was 29 cm.  The IMF distance was 13 cm.  Her BMI was 32.2 kg/m.  Her preoperative bra size was 38 DD cup.  Patient reported that she would like to be a B cup.  The estimated excess breast tissue to be removed at the time of surgery was approximately 400 g on each side.  Physical therapy was ordered for the patient.  Today, patient reports she is doing well.  She states that she completed at least 6 sessions of physical therapy.  She states that physical therapy has made a little improvement, but she reports the aching upper back pain is still present.  She also reports she does get rashes underneath her breasts.  Patient reports she recently was diagnosed with tendinitis in her ankles bilaterally.  She denies any other changes in her health.  She denies any recent hospitalizations, infections or traumas.  She denies any recent fevers, chills or shortness of breath.  Patient reports she had mammogram completed last August or September.    Review of Systems General: Denies fevers, chills shortness of breath. MSK: Endorses ongoing back and neck discomfort Skin: Endorses rashes underneath breasts bilaterally.   Assessment/Plan  Patient is interested in pursuing surgical intervention for bilateral breast reduction. Patient has completed at least 6 weeks of physical  therapy for pain related to macromastia.  Discussed with patient we would submit to insurance for authorization, discussed approval could take up to 6 weeks.   Discussed with patient that once surgery is scheduled, she should hold her plaquenil 1 month prior to the date of surgery. Patient acknowledged.   I connected with  Jomarie Longs on 09/26/21 by telephone and verified that I am speaking with the correct person using two identifiers.  The patient was located at home and I, the provider, and located at the clinic.  Approximately 9 minutes was spent together on the phone.   Total amount of time spent on this encounter was 15 minutes. Greater than 50% of this time was spent with the patient during the visit. Time also includes time spent reviewing the patient's chart before and after the visit and documenting the encounter.      Clance Boll 09/26/2021, 11:42 AM

## 2021-10-10 ENCOUNTER — Encounter: Payer: Self-pay | Admitting: Gastroenterology

## 2021-10-10 DIAGNOSIS — E559 Vitamin D deficiency, unspecified: Secondary | ICD-10-CM

## 2021-10-10 DIAGNOSIS — D509 Iron deficiency anemia, unspecified: Secondary | ICD-10-CM

## 2021-10-11 ENCOUNTER — Other Ambulatory Visit (INDEPENDENT_AMBULATORY_CARE_PROVIDER_SITE_OTHER): Payer: Medicare Other

## 2021-10-11 DIAGNOSIS — E559 Vitamin D deficiency, unspecified: Secondary | ICD-10-CM

## 2021-10-11 DIAGNOSIS — D509 Iron deficiency anemia, unspecified: Secondary | ICD-10-CM | POA: Diagnosis not present

## 2021-10-11 LAB — CBC WITH DIFFERENTIAL/PLATELET
Basophils Absolute: 0 10*3/uL (ref 0.0–0.1)
Basophils Relative: 0.4 % (ref 0.0–3.0)
Eosinophils Absolute: 0.1 10*3/uL (ref 0.0–0.7)
Eosinophils Relative: 2.6 % (ref 0.0–5.0)
HCT: 38.4 % (ref 36.0–46.0)
Hemoglobin: 13.1 g/dL (ref 12.0–15.0)
Lymphocytes Relative: 30.7 % (ref 12.0–46.0)
Lymphs Abs: 1.6 10*3/uL (ref 0.7–4.0)
MCHC: 34.2 g/dL (ref 30.0–36.0)
MCV: 92.2 fl (ref 78.0–100.0)
Monocytes Absolute: 0.5 10*3/uL (ref 0.1–1.0)
Monocytes Relative: 10.3 % (ref 3.0–12.0)
Neutro Abs: 3 10*3/uL (ref 1.4–7.7)
Neutrophils Relative %: 56 % (ref 43.0–77.0)
Platelets: 260 10*3/uL (ref 150.0–400.0)
RBC: 4.17 Mil/uL (ref 3.87–5.11)
RDW: 13.5 % (ref 11.5–15.5)
WBC: 5.3 10*3/uL (ref 4.0–10.5)

## 2021-10-11 LAB — IBC + FERRITIN
Ferritin: 53.8 ng/mL (ref 10.0–291.0)
Iron: 73 ug/dL (ref 42–145)
Saturation Ratios: 18.4 % — ABNORMAL LOW (ref 20.0–50.0)
TIBC: 397.6 ug/dL (ref 250.0–450.0)
Transferrin: 284 mg/dL (ref 212.0–360.0)

## 2021-10-11 LAB — VITAMIN D 25 HYDROXY (VIT D DEFICIENCY, FRACTURES): VITD: 63.28 ng/mL (ref 30.00–100.00)

## 2021-10-13 ENCOUNTER — Encounter: Payer: Self-pay | Admitting: Gastroenterology

## 2021-11-01 ENCOUNTER — Telehealth: Payer: Self-pay | Admitting: *Deleted

## 2021-11-01 NOTE — Telephone Encounter (Signed)
Pt called to check on status of insurance authorization and would like to know when she can expect to schedule her surgery.  Please advise pt at [phone]

## 2021-11-08 ENCOUNTER — Encounter: Payer: Self-pay | Admitting: *Deleted

## 2021-11-20 ENCOUNTER — Other Ambulatory Visit: Payer: Self-pay

## 2021-11-20 MED ORDER — PROPRANOLOL HCL ER 60 MG PO CP24: 60.0000 mg | ORAL_CAPSULE | Freq: Every day | ORAL | 0 refills | Status: DC

## 2021-11-22 ENCOUNTER — Ambulatory Visit: Payer: Medicare Other | Admitting: Physician Assistant

## 2021-11-26 NOTE — Progress Notes (Unsigned)
PATIENT: Cynthia Strickland DOB: December 08, 1950  REASON FOR VISIT: follow up HISTORY FROM: patient  No chief complaint on file.   HISTORY OF PRESENT ILLNESS:  11/26/21 ALL: Cynthia Strickland returns for follow up for migraines. She continues propranolol ER '60mg'$  daily and Nurtec as needed.   11/27/2020 ALL: Cynthia Strickland returns for follow up for migraines. She continues propranolol ER '60mg'$  daily and Nurtec as needed. She feels headaches are very well managed. She has only had two migraines in the past year. Nurtec works well for abortive therapy. She lost her son July 2021. He was her only child and had suffered from depression and alcoholism. She does feel that she has a great support system with her husband and church community.   07/14/2019 ALL:  Cynthia Strickland is a 71 y.o. female here today for follow up for migraines. She was started on proranolol '60mg'$  and Relpax switched to Nurtec at last visit in 12/2018. She has also continued meloxicam 7.'5mg'$  daily, more for RA and usually prescribed by rhumatology. Creatinine normal 11/2018. She has not had any headaches since last visit. She had a visual aura late October and took a Nurtec. She never developed a migraine. BP has been well managed. Readings are elevated d/t white coat syndrome. Home readings are usually less than 140/80. She is seen regularly by PCP.   HISTORY: (copied from [name] note on 01/13/2019)  Cynthia Strickland is a 71 y.o. woman with chronci migraines.   Update 01/13/2019: She had a severe headache 12/05/2018.  She took Relpax and then had slurred speech.  She went to the [facility] and was transferred to [facility] for an MRI.  She had an MRI which showed no acute findings.   She had an elevated BP as well (194/112).   She received IV Benadryl, Compazine, Toradol, Decadron, Depacon (later in ED).    Her speech took about 5 hours to completely clear.    She notes she knew what she wanted to say but could not get the words out in  phrases or sentences.      She gets occasional migraines that occur randomly but average just 1 / month.   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     She has a history of elevated BP when nervous but is typically 125-130/65-70 when she tests at home or when relaxed.   She does have a PFO on ECHO.        From Initial Consultation 08/30/2018: About 7 years ago I saw her at [facility] for classic migraine headaches.   As they were not frequent, she was just placed on Relpax.   A brain MRI was done.   She had some nonspecific foci on brain MRI and we did a bubble study ECHO that showed a PFO.   These typical migraines for her usually have a visual aura followed by pounding pain 15 minutes later.  She gets nausea and moving worsens the pain.  She usually has some photophobia and phonophobia.  Typical migraines continued to occur rarely.     A month ago, she began to note short bursts of sharp pain in the right eye followed by a duller pain in the temple and forehead and occiput, all on the right.   These seem different than her typical migraine headaches.  Specifically, she  does not have any photophobia or phonophobia and there is much milder nausea.   Pain is the same in the morning and night when present.    NSAID's help a little bit.    She saw Dr. [name] (Ophthalmology) who referred her to the office.   VA was 20/25 either eye.  Tonometry was 18 / 18.   PERRLA, VF full.  There is Mild OS conjunctivochalasis, mild cataracts.  Normal discs.      She was placed on an ointment for the mild blepharitis, advised to use artificial tears and referred for further evaluation.      She has OSA and is using CPAP.    REVIEW OF SYSTEMS: Out of a complete 14 system review of symptoms, the patient complains only of the following symptoms, headaches, chronic pain  and all other reviewed systems are negative.   ALLERGIES: Allergies  Allergen Reactions   Codeine Nausea Only and Nausea And Vomiting   Humira [Adalimumab] Rash   Sulfonamide Derivatives Rash    HOME MEDICATIONS: Outpatient Medications Prior to Visit  Medication Sig Dispense Refill   aspirin EC 81 MG tablet Take 81 mg by mouth daily. (Patient not taking: Reported on 08/06/2021)     Cholecalciferol (VITAMIN D) 50 MCG (2000 UT) tablet Take 2,000 Units by mouth daily.     DULoxetine (CYMBALTA) 30 MG capsule Take 30 mg by mouth daily.     ferrous sulfate 325 (65 FE) MG tablet Take 1 tablet by mouth daily with breakfast.     FIBER ADULT GUMMIES PO Take 1 tablet by mouth daily.     hydroxychloroquine (PLAQUENIL) 200 MG tablet Take 200 mg by mouth 2 (two) times daily.     ibuprofen (ADVIL) 800 MG tablet Take 800 mg by mouth as needed.     meloxicam (MOBIC) 7.5 MG tablet Take 1 tablet (7.5 mg total) by mouth daily. (Patient taking differently: Take 7.5 mg by mouth as needed for pain.) 30 tablet 5   omeprazole (PRILOSEC) 40 MG capsule Take 1 capsule (40 mg total) by mouth daily. Take 1 tablet by mouth once to twice daily 90 capsule 3   Probiotic Product (ALIGN PO) Take 1 tablet by mouth daily.     promethazine (PHENERGAN) 12.5 MG tablet TAKE 1 TABLET (12.5 MG TOTAL) BY MOUTH EVERY 6 (SIX) HOURS AS NEEDED FOR NAUSEA OR VOMITING. 30 tablet 1   [START ON 11/27/2021] propranolol ER (INDERAL LA) 60 MG 24 hr capsule Take 1 capsule (60 mg total) by mouth daily. 90 capsule 0   Rimegepant Sulfate (NURTEC) 75 MG TBDP Take 75 mg by mouth daily as needed. For migraines. Take as close to onset of migraine as possible. One daily maximum. 8 tablet 11   simvastatin (ZOCOR) 20 MG tablet Take 20 mg by mouth daily at 6 PM.      traMADol (ULTRAM) 50 MG tablet every 6 (six) hours as needed.      traZODone (DESYREL) 50 MG tablet Take 50 mg by mouth at bedtime.     No facility-administered medications prior to visit.     PAST MEDICAL HISTORY: Past Medical History:  Diagnosis Date   Allergy    seasonal    Anxiety    Barretts esophagus    Cataract    GERD (gastroesophageal reflux disease)    Hiatal hernia    Hyperlipidemia    Hypertension    white coat syndrome   Migraine headache with aura    Mitral regurgitation 05-19-11  and Tricuspid Regurgitation(had Echo w/Bubble study)for migraines   Rheumatoid arthritis ([place]) 12/2012   Sleep apnea    4/21-not using cpap, getting fitted for mouthguard    PAST SURGICAL HISTORY: Past Surgical History:  Procedure Laterality Date   CESAREAN SECTION  1976   COLONOSCOPY  2011   LAPAROSCOPIC OOPHORECTOMY  2005   UPPER GASTROINTESTINAL ENDOSCOPY     VAGINAL HYSTERECTOMY  1995    FAMILY HISTORY: Family History  Problem Relation Age of Onset   Endometrial cancer Mother    Heart disease Father    Colon polyps Sister    Heart disease Maternal Grandmother    Stroke Maternal Grandmother    Pancreatic cancer Maternal Grandfather    Heart disease Paternal Grandfather    Stroke Paternal Grandfather    Colon cancer Neg Hx    Esophageal cancer Neg Hx    Rectal cancer Neg Hx    Stomach cancer Neg Hx     SOCIAL HISTORY: Social History   Socioeconomic History   Marital status: Married    Spouse name: Not on file   Number of children: 1   Years of education: Not on file   Highest education level: Not on file  Occupational History   Occupation: retired - worked in opthalmology office  Tobacco Use   Smoking status: Former    Years: 13.00    Types: Cigarettes    Quit date: 03/31/1993    Years since quitting: 28.6   Smokeless tobacco: Never  Vaping Use   Vaping Use: Never used  Substance and Sexual Activity   Alcohol use: Yes    Alcohol/week: 2.0 standard drinks of alcohol    Types: 2 Glasses of wine per week    Comment: Wine sometimes-weekends   Drug use: No   Sexual activity: Not on file  Other Topics Concern   Not on file  Social History  Narrative   Right handed    Caffeine use: coffee very morning   No soda   Social Determinants of Health   Financial Resource Strain: Not on file  Food Insecurity: Not on file  Transportation Needs: Not on file  Physical Activity: Not on file  Stress: Not on file  Social Connections: Not on file  Intimate Partner Violence: Not on file      PHYSICAL EXAM  There were no vitals filed for this visit.   There is no height or weight on file to calculate BMI.  Generalized: Well developed, in no acute distress  Cardiology: normal rate and rhythm, no murmur noted Respiratory: clear to auscultation bilaterally   Neurological examination  Mentation: Alert oriented to time, place, history taking. Follows all commands speech and language fluent Cranial nerve II-XII: Pupils were equal round reactive to light. Extraocular movements were full, visual field were full  Motor: The motor testing reveals 5 over 5 strength of all 4 extremities. Good symmetric motor tone is noted throughout.  Sensory: Sensory testing is intact to soft touch on all 4 extremities. No evidence of extinction is noted.  Coordination: Cerebellar testing reveals good finger-nose-finger and heel-to-shin bilaterally.  Gait and station: Gait is normal.   DIAGNOSTIC DATA (LABS, IMAGING, TESTING) - I reviewed patient records, labs, notes, testing and imaging myself where available.      No data to display           Lab Results  Component Value Date   WBC 5.3 10/11/2021   HGB 13.1 10/11/2021   HCT 38.4 10/11/2021  MCV 92.2 10/11/2021   PLT 260.0 10/11/2021      Component Value Date/Time   NA 140 12/05/2018 2243   K 3.4 (L) 12/05/2018 2243   CL 104 12/05/2018 2243   CO2 23 12/05/2018 2243   GLUCOSE 100 (H) 12/05/2018 2243   BUN 16 12/05/2018 2243   CREATININE 0.78 12/05/2018 2243   CALCIUM 9.2 12/05/2018 2243   PROT 7.1 12/05/2018 2243   ALBUMIN 4.6 12/05/2018 2243   AST 28 12/05/2018 2243   ALT 25  12/05/2018 2243   ALKPHOS 59 12/05/2018 2243   BILITOT 1.1 12/05/2018 2243   GFRNONAA >60 12/05/2018 2243   GFRAA >60 12/05/2018 2243   Lab Results  Component Value Date   CHOL 147 09/25/2011   HDL 40.70 09/25/2011   LDLCALC 89 09/25/2011   TRIG 87.0 09/25/2011   CHOLHDL 4 09/25/2011   No results found for: "HGBA1C" No results found for: "VITAMINB12" No results found for: "TSH"     ASSESSMENT AND PLAN 71 y.o. year old female  has a past medical history of Allergy, Anxiety, Barretts esophagus, Cataract, GERD (gastroesophageal reflux disease), Hiatal hernia, Hyperlipidemia, Hypertension, Migraine headache with aura, Mitral regurgitation (05-19-11), Rheumatoid arthritis ([place]) (12/2012), and Sleep apnea. here with   No diagnosis found.    She is doing very well. We will continue propranolol ER '60mg'$  daily and Nurtec as needed. She will keep an eye on her BP. Usually normal at PCP. She will work on healthy lifestyle changes. Adequate hydration, well balanced diet and regular exercise advised. She will reach out to healthy weight and wellness for weight management. She will follow up with [place] in 1 year, sooner if needed. She verbalizes understanding and agreement with this plan.    No orders of the defined types were placed in this encounter.     No orders of the defined types were placed in this encounter.      Debbora Presto, FNP-C 11/26/2021, 4:21 PM [facility] [address], [place], [place] [address] [phone]

## 2021-11-26 NOTE — Patient Instructions (Signed)
Below is our plan:  We will continue propranolol 60mg  daily and Nurtec as needed   Please make sure you are staying well hydrated. I recommend 50-60 ounces daily. Well balanced diet and regular exercise encouraged. Consistent sleep schedule with 6-8 hours recommended.   Please continue follow up with care team as directed.   Follow up with me in 1 year. If PCP willing to fill propranolol and Nurtec may follow up as needed.   You may receive a survey regarding today's visit. I encourage you to leave honest feed back as I do use this information to improve patient care. Thank you for seeing me today!

## 2021-11-27 ENCOUNTER — Ambulatory Visit (INDEPENDENT_AMBULATORY_CARE_PROVIDER_SITE_OTHER): Payer: Medicare Other | Admitting: Family Medicine

## 2021-11-27 ENCOUNTER — Encounter: Payer: Self-pay | Admitting: Family Medicine

## 2021-11-27 VITALS — BP 147/93 | HR 60 | Ht 60.0 in | Wt 166.5 lb

## 2021-11-27 DIAGNOSIS — G43109 Migraine with aura, not intractable, without status migrainosus: Secondary | ICD-10-CM

## 2021-11-27 DIAGNOSIS — G43711 Chronic migraine without aura, intractable, with status migrainosus: Secondary | ICD-10-CM | POA: Diagnosis not present

## 2021-11-27 MED ORDER — NURTEC 75 MG PO TBDP: 75.0000 mg | ORAL_TABLET | Freq: Every day | ORAL | 11 refills | Status: AC | PRN

## 2021-11-27 MED ORDER — PROPRANOLOL HCL ER 60 MG PO CP24: 60.0000 mg | ORAL_CAPSULE | Freq: Every day | ORAL | 3 refills | Status: AC

## 2021-12-01 IMAGING — MG MM DIGITAL DIAGNOSTIC UNILAT*L* W/ TOMO W/ CAD
4 series · 4 of 12 positions shown · non-contrast
Comparison: Previous exam(s).

CLINICAL DATA: Patient was called back from screening mammogram for
a possible asymmetry in the left breast.

EXAM:
DIGITAL DIAGNOSTIC UNILATERAL LEFT MAMMOGRAM WITH TOMO AND CAD

[L ML synth-2D]
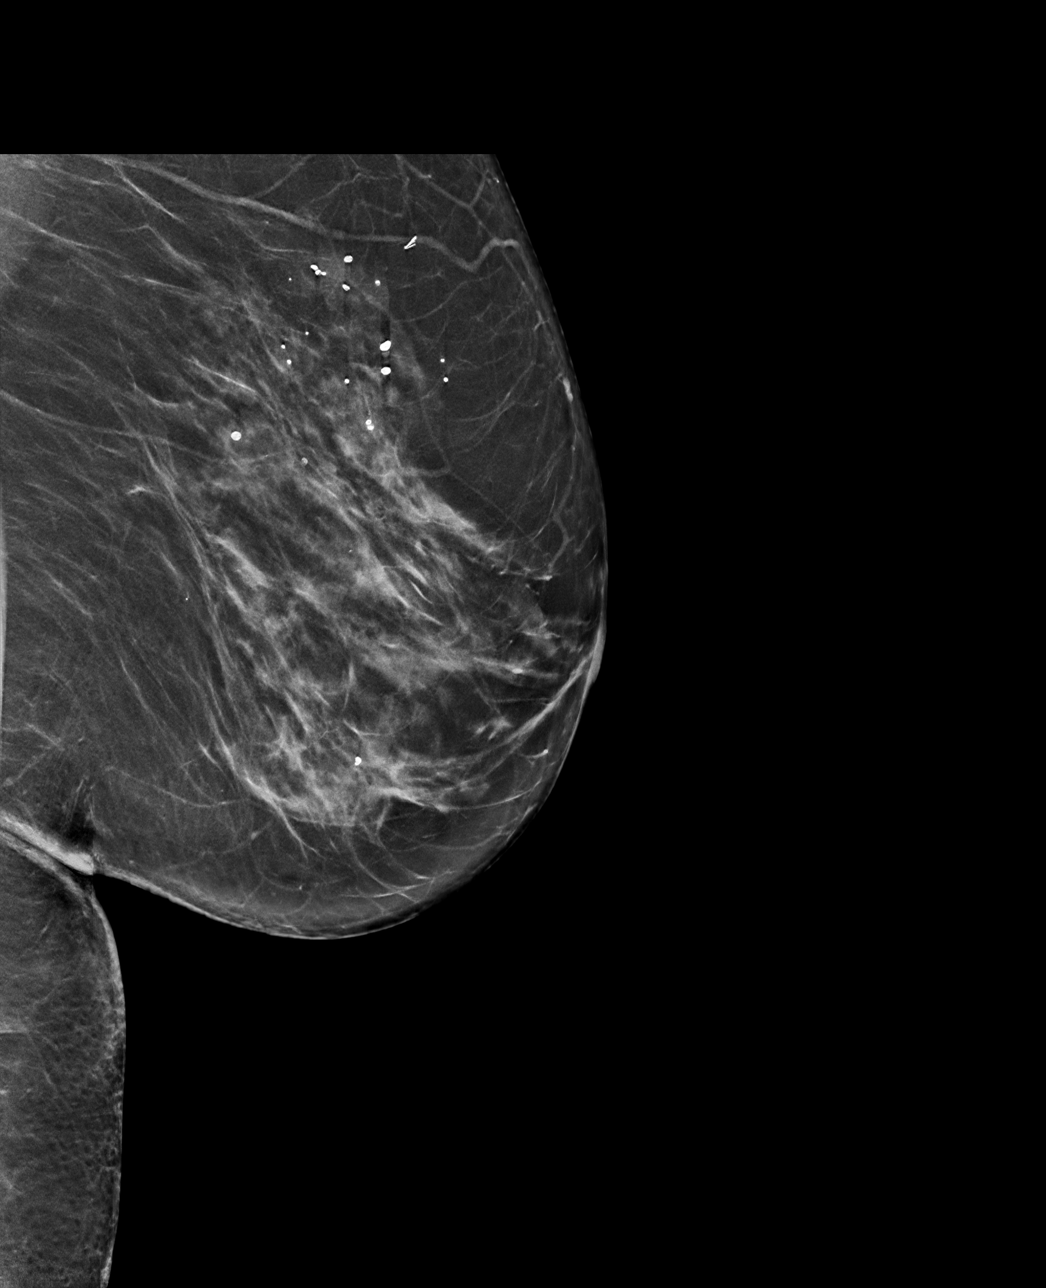

[L CC synth-2D]
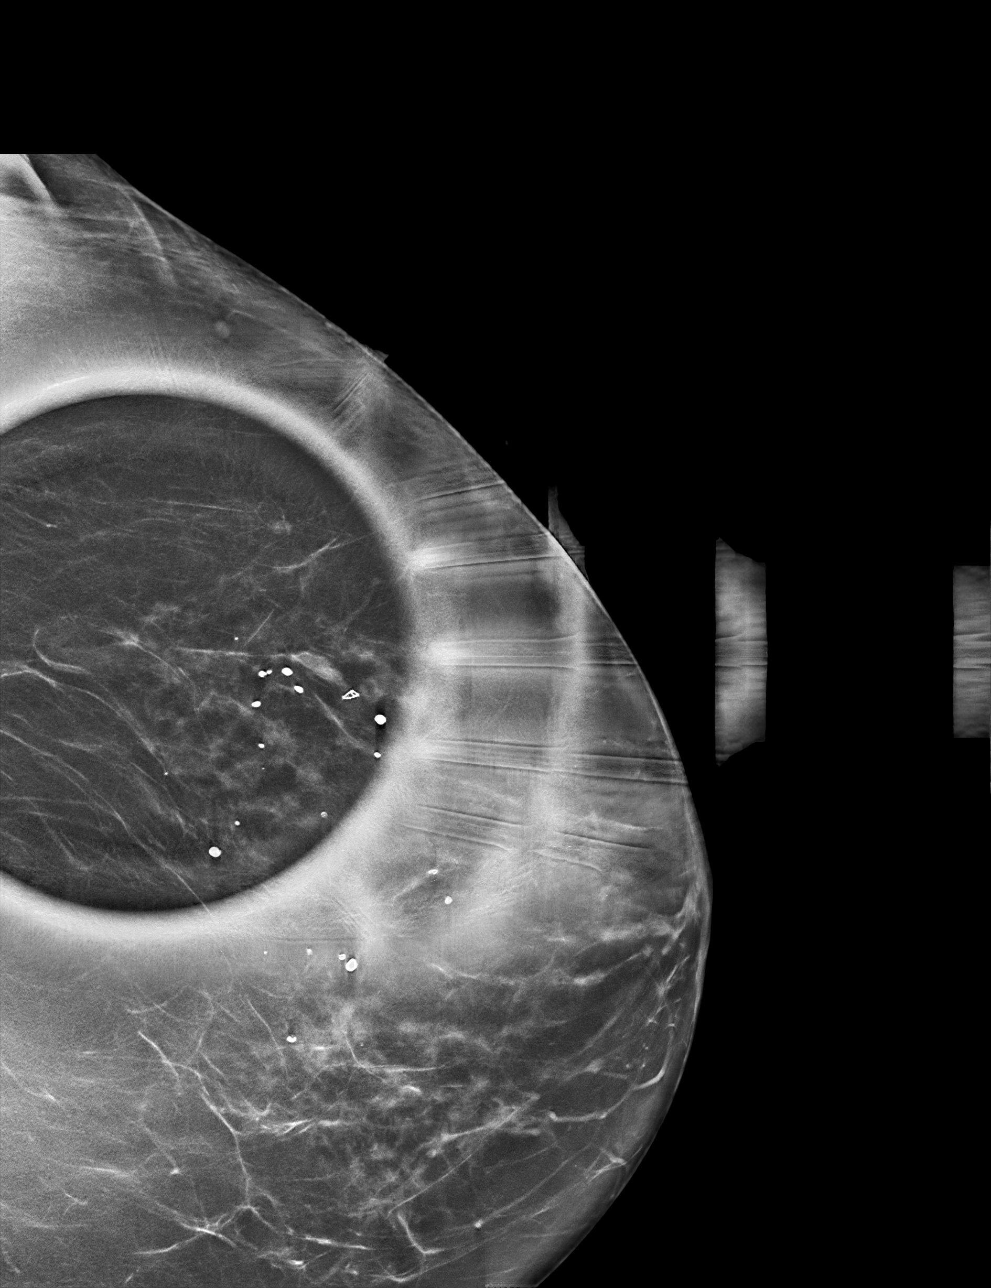

[L CC tomo · tomo slice 37/74.0]
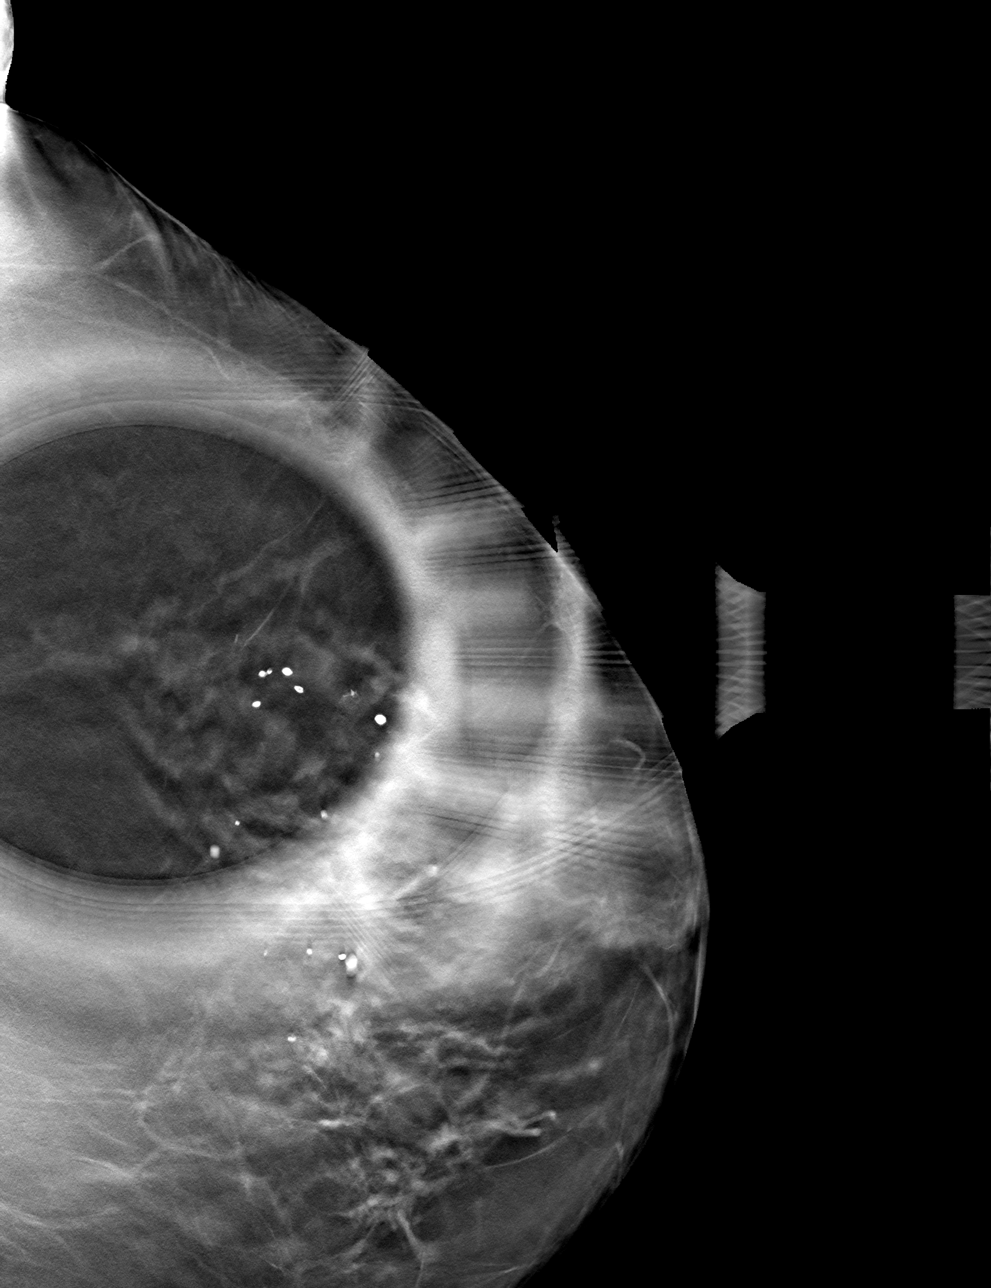

[L ML tomo · tomo slice 39/76.0]
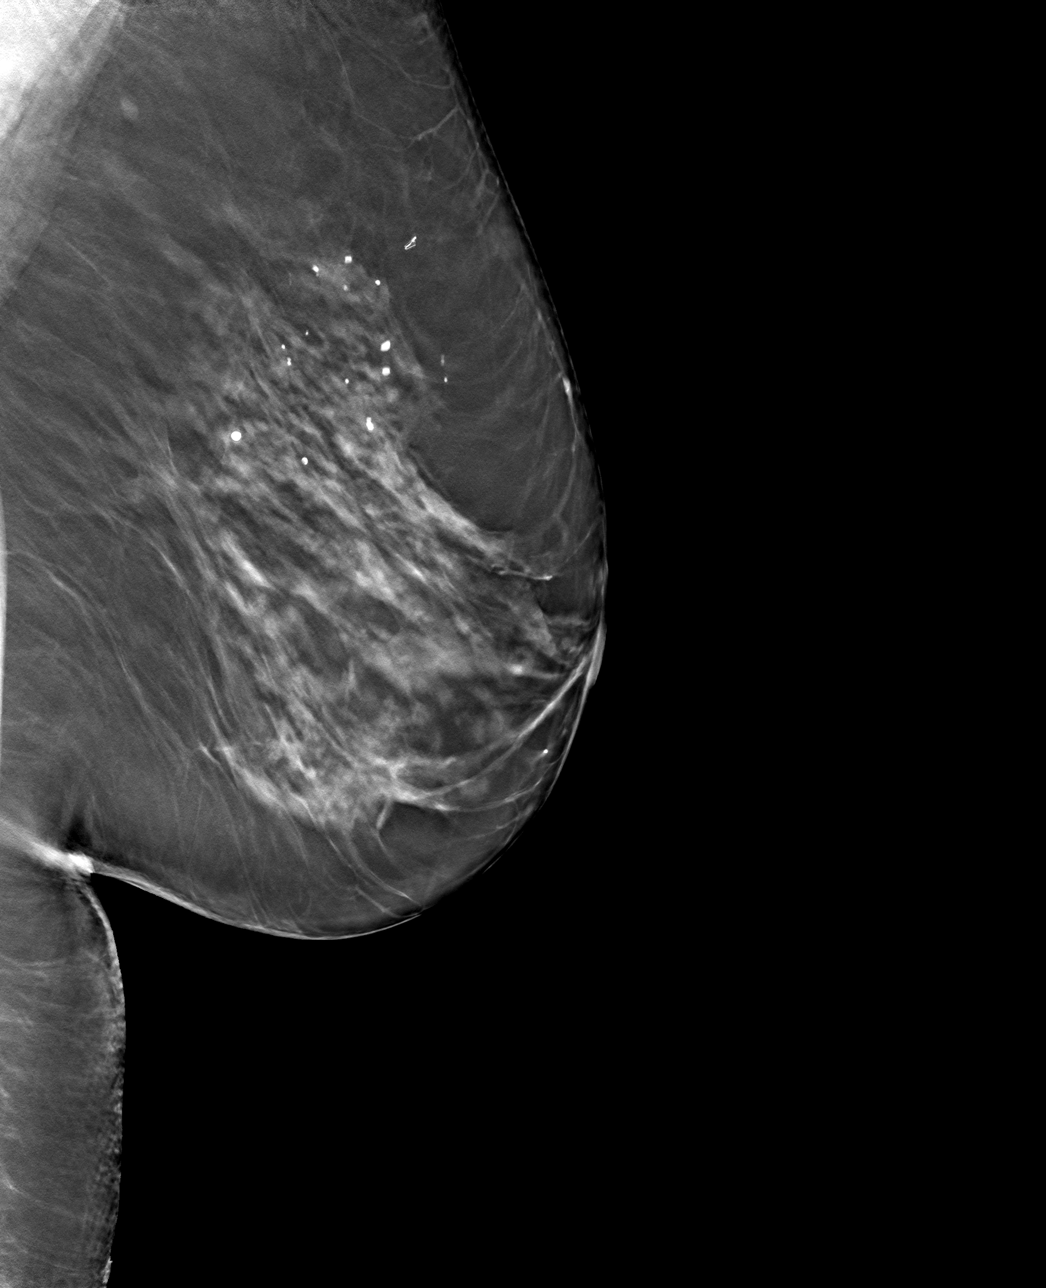

[4 of 12 positions shown; findings below may reference images not displayed]

ACR Breast Density Category c: The breast tissue is heterogeneously
dense, which may obscure small masses.
FINDINGS: Additional imaging of the left breast was performed. No suspicious
mass, malignant type microcalcifications or distortion detected.

Mammographic images were processed with CAD.
IMPRESSION: No evidence of malignancy in the left breast.

RECOMMENDATION:
Bilateral screening mammogram in Thursday October, 2020 is recommended.

I have discussed the findings and recommendations with the patient.
If applicable, a reminder letter will be sent to the patient
regarding the next appointment.

BI-RADS CATEGORY  1: Negative.

## 2021-12-03 ENCOUNTER — Ambulatory Visit (INDEPENDENT_AMBULATORY_CARE_PROVIDER_SITE_OTHER): Payer: Medicare Other | Admitting: Plastic Surgery

## 2021-12-03 ENCOUNTER — Encounter: Payer: Self-pay | Admitting: Plastic Surgery

## 2021-12-03 VITALS — Wt 166.6 lb

## 2021-12-03 DIAGNOSIS — Z1231 Encounter for screening mammogram for malignant neoplasm of breast: Secondary | ICD-10-CM

## 2021-12-03 DIAGNOSIS — R21 Rash and other nonspecific skin eruption: Secondary | ICD-10-CM

## 2021-12-03 DIAGNOSIS — G8929 Other chronic pain: Secondary | ICD-10-CM

## 2021-12-03 DIAGNOSIS — N62 Hypertrophy of breast: Secondary | ICD-10-CM | POA: Diagnosis not present

## 2021-12-03 DIAGNOSIS — M542 Cervicalgia: Secondary | ICD-10-CM

## 2021-12-03 DIAGNOSIS — M549 Dorsalgia, unspecified: Secondary | ICD-10-CM | POA: Diagnosis not present

## 2021-12-03 NOTE — Progress Notes (Addendum)
   Subjective:    Patient ID: Cynthia Strickland, female    DOB: September 09, 1950, 71 y.o.   MRN: [id number]  The patient is a 71 year old female here for a further evaluation for breast reduction surgery.  She complains of neck and back pain.  She constantly has to readjust her bra straps.  She has indentations in her shoulders.  She sometimes uses Motrin or Tylenol to help relieve the discomfort.  It is difficult for her to do heavy exercise because of the excess weight.  They are fairly symmetric with a sternal to nipple distance of 28 on the right and 29 on the left.  Her inframammary distance from the inframammary fold to the nipple is 13 cm.  She is 5 feet tall and weighs 166 pounds.  She is a 15 DD D preoperatively and would like to be a C postoperatively but she would be okay with a B cup.  The estimated amount for removal is 500 g from each breast.      Review of Systems  Constitutional:  Positive for activity change. Negative for appetite change.  Eyes: Negative.   Respiratory: Negative.  Negative for chest tightness and shortness of breath.   Gastrointestinal: Negative.   Endocrine: Negative.   Genitourinary: Negative.   Musculoskeletal:  Positive for back pain and neck pain.  Skin:  Positive for rash.       Objective:   Physical Exam Nursing note reviewed.  Constitutional:      Appearance: Normal appearance.  HENT:     Head: Normocephalic.  Cardiovascular:     Rate and Rhythm: Normal rate.     Pulses: Normal pulses.  Pulmonary:     Effort: Pulmonary effort is normal.  Abdominal:     Palpations: Abdomen is soft.  Musculoskeletal:        General: No swelling or deformity.  Skin:    General: Skin is warm.     Capillary Refill: Capillary refill takes less than 2 seconds.  Neurological:     Mental Status: She is alert and oriented to person, place, and time.  Psychiatric:        Mood and Affect: Mood normal.        Behavior: Behavior normal.        Thought Content: Thought  content normal.        Judgment: Judgment normal.         Assessment & Plan:     ICD-10-CM   1. Symptomatic mammary hypertrophy  N62     2. Neck pain  M54.2     3. Chronic bilateral thoracic back pain  M54.6    G89.29       Patient is a good candidate for breast reduction with liposuction.  The patient states she will be ok with B cup if needed to get off the 500 gm.

## 2021-12-12 ENCOUNTER — Telehealth: Payer: Self-pay

## 2021-12-12 NOTE — Telephone Encounter (Signed)
Patient called in wanting to update information regarding her upcoming breast reduction in November. She stated at her last visit she had talked about being a B or a C. She stated she thought about it and a "C" would be fine (the 500 g off).   Just wanted to relay the message.

## 2021-12-16 NOTE — Telephone Encounter (Signed)
Sent to front to schedule

## 2022-01-03 ENCOUNTER — Telehealth: Payer: Self-pay | Admitting: *Deleted

## 2022-01-03 NOTE — Telephone Encounter (Signed)
MyChart message received from patient that she wished to cancel her surgery for now due to a knee injury. She will contact us when she is ready to reschedule.

## 2022-01-24 ENCOUNTER — Encounter: Payer: Medicare Other | Admitting: Surgical

## 2022-01-27 ENCOUNTER — Encounter: Payer: Medicare Other | Admitting: Surgical

## 2022-01-28 ENCOUNTER — Encounter: Payer: Medicare Other | Admitting: Surgical

## 2022-02-18 ENCOUNTER — Encounter: Payer: Medicare Other | Admitting: Plastic Surgery

## 2022-03-20 ENCOUNTER — Encounter: Payer: Self-pay | Admitting: Gastroenterology

## 2022-04-08 ENCOUNTER — Telehealth: Payer: Self-pay

## 2022-04-08 DIAGNOSIS — E559 Vitamin D deficiency, unspecified: Secondary | ICD-10-CM

## 2022-04-08 DIAGNOSIS — D509 Iron deficiency anemia, unspecified: Secondary | ICD-10-CM

## 2022-04-08 NOTE — Telephone Encounter (Signed)
Called and spoke to patient. She understands to go to the lab in the basement this week or next

## 2022-04-08 NOTE — Telephone Encounter (Signed)
-----   Message from Roetta Sessions, [place] sent at 10/14/2021 12:22 PM EDT ----- Regarding: due for labs Due for cbc and IBC/Ferritin in mid January

## 2022-04-18 ENCOUNTER — Other Ambulatory Visit (INDEPENDENT_AMBULATORY_CARE_PROVIDER_SITE_OTHER): Payer: Medicare Other

## 2022-04-18 DIAGNOSIS — E559 Vitamin D deficiency, unspecified: Secondary | ICD-10-CM | POA: Diagnosis not present

## 2022-04-18 DIAGNOSIS — D509 Iron deficiency anemia, unspecified: Secondary | ICD-10-CM

## 2022-04-18 LAB — IBC + FERRITIN
Ferritin: 67.1 ng/mL (ref 10.0–291.0)
Iron: 74 ug/dL (ref 42–145)
Saturation Ratios: 20.9 % (ref 20.0–50.0)
TIBC: 354.2 ug/dL (ref 250.0–450.0)
Transferrin: 253 mg/dL (ref 212.0–360.0)

## 2022-04-18 LAB — CBC WITH DIFFERENTIAL/PLATELET
Basophils Absolute: 0 10*3/uL (ref 0.0–0.1)
Basophils Relative: 0.4 % (ref 0.0–3.0)
Eosinophils Absolute: 0.1 10*3/uL (ref 0.0–0.7)
Eosinophils Relative: 2 % (ref 0.0–5.0)
HCT: 39.3 % (ref 36.0–46.0)
Hemoglobin: 13.3 g/dL (ref 12.0–15.0)
Lymphocytes Relative: 28.8 % (ref 12.0–46.0)
Lymphs Abs: 1.7 10*3/uL (ref 0.7–4.0)
MCHC: 33.9 g/dL (ref 30.0–36.0)
MCV: 91.8 fl (ref 78.0–100.0)
Monocytes Absolute: 0.5 10*3/uL (ref 0.1–1.0)
Monocytes Relative: 8.9 % (ref 3.0–12.0)
Neutro Abs: 3.6 10*3/uL (ref 1.4–7.7)
Neutrophils Relative %: 59.9 % (ref 43.0–77.0)
Platelets: 278 10*3/uL (ref 150.0–400.0)
RBC: 4.27 Mil/uL (ref 3.87–5.11)
RDW: 12.9 % (ref 11.5–15.5)
WBC: 6 10*3/uL (ref 4.0–10.5)

## 2022-04-21 ENCOUNTER — Encounter: Payer: Self-pay | Admitting: Gastroenterology

## 2022-04-22 NOTE — Telephone Encounter (Signed)
40-monthlab reminder in epic.

## 2022-05-14 ENCOUNTER — Telehealth: Payer: Self-pay | Admitting: Physician Assistant

## 2022-05-14 NOTE — Telephone Encounter (Signed)
Patient is returning call states she has plenty of her medication and does not need a refill.

## 2022-05-14 NOTE — Telephone Encounter (Signed)
Left VM to call back to schedule an appointment

## 2022-05-27 ENCOUNTER — Other Ambulatory Visit: Payer: Self-pay | Admitting: Physician Assistant

## 2022-06-23 ENCOUNTER — Other Ambulatory Visit: Payer: Self-pay | Admitting: *Deleted

## 2022-06-23 NOTE — Telephone Encounter (Signed)
Last seen on 11/27/21 1 year follow up not scheduled  Last filled on 06/20/22 # 90 tablets.

## 2022-07-23 ENCOUNTER — Telehealth: Payer: Self-pay

## 2022-07-23 DIAGNOSIS — D509 Iron deficiency anemia, unspecified: Secondary | ICD-10-CM

## 2022-07-23 NOTE — Telephone Encounter (Signed)
-----   Message from Missy Sabins, RN sent at 04/22/2022 12:24 PM EST ----- Regarding: Labs CBC, IBC + Ferritin - need to enter orders

## 2022-07-23 NOTE — Telephone Encounter (Signed)
Lab orders in epic. MyChart message sent to patient with lab reminder. 

## 2022-07-26 ENCOUNTER — Other Ambulatory Visit: Payer: Self-pay | Admitting: Physician Assistant

## 2022-07-30 NOTE — Telephone Encounter (Signed)
Called and left patient a detailed vm lab reminder. I advised that lab reminder was also sent via MyChart.

## 2022-08-11 ENCOUNTER — Other Ambulatory Visit (INDEPENDENT_AMBULATORY_CARE_PROVIDER_SITE_OTHER): Payer: Medicare Other

## 2022-08-11 DIAGNOSIS — D509 Iron deficiency anemia, unspecified: Secondary | ICD-10-CM | POA: Diagnosis not present

## 2022-08-11 LAB — CBC WITH DIFFERENTIAL/PLATELET
Basophils Absolute: 0 10*3/uL (ref 0.0–0.1)
Basophils Relative: 0.6 % (ref 0.0–3.0)
Eosinophils Absolute: 0.2 10*3/uL (ref 0.0–0.7)
Eosinophils Relative: 2.8 % (ref 0.0–5.0)
HCT: 38.2 % (ref 36.0–46.0)
Hemoglobin: 13 g/dL (ref 12.0–15.0)
Lymphocytes Relative: 30.5 % (ref 12.0–46.0)
Lymphs Abs: 1.7 10*3/uL (ref 0.7–4.0)
MCHC: 34 g/dL (ref 30.0–36.0)
MCV: 92 fl (ref 78.0–100.0)
Monocytes Absolute: 0.6 10*3/uL (ref 0.1–1.0)
Monocytes Relative: 10.7 % (ref 3.0–12.0)
Neutro Abs: 3.1 10*3/uL (ref 1.4–7.7)
Neutrophils Relative %: 55.4 % (ref 43.0–77.0)
Platelets: 296 10*3/uL (ref 150.0–400.0)
RBC: 4.15 Mil/uL (ref 3.87–5.11)
RDW: 13.7 % (ref 11.5–15.5)
WBC: 5.5 10*3/uL (ref 4.0–10.5)

## 2022-08-11 LAB — IBC + FERRITIN
Ferritin: 40.9 ng/mL (ref 10.0–291.0)
Iron: 77 ug/dL (ref 42–145)
Saturation Ratios: 22.8 % (ref 20.0–50.0)
TIBC: 337.4 ug/dL (ref 250.0–450.0)
Transferrin: 241 mg/dL (ref 212.0–360.0)

## 2022-08-22 ENCOUNTER — Other Ambulatory Visit: Payer: Self-pay | Admitting: Gastroenterology

## 2022-12-31 ENCOUNTER — Other Ambulatory Visit: Payer: Self-pay | Admitting: Gastroenterology

## 2023-01-13 ENCOUNTER — Telehealth: Payer: Self-pay | Admitting: Family Medicine

## 2023-01-13 NOTE — Telephone Encounter (Signed)
Pt has scheduled her needed 1 yr f/u , she states she is out of her propranolol ER (INDERAL LA) 60 MG 24 hr capsule .  Pt is asking if enough medication can be called in until her appointment 10/28. Pt still using  [id number]

## 2023-01-13 NOTE — Telephone Encounter (Signed)
LVM for pt to call office.  Last saw AL,NP 10/2021. Per note, she could follow up in a yr or see if PCP can refill meds moving forward and just follow up here as needed. If she is stable, she could see if PCP can refill. If not, we are happy to see for follow up and ok to refill until appt per Amy Lomax,NP.

## 2023-01-14 NOTE — Telephone Encounter (Signed)
Called pt. Relayed message. She did not remember conversation about f/u with PCP/refills. She is stable and will f/u with PCP about getting refill on propranolol. She asked appt w/ Amy be cx. I cx this. She will call back if PCP unable to fill moving forward.

## 2023-01-26 ENCOUNTER — Ambulatory Visit: Payer: Medicare Other | Admitting: Family Medicine

## 2023-02-16 ENCOUNTER — Other Ambulatory Visit (HOSPITAL_COMMUNITY): Payer: Self-pay

## 2023-05-11 ENCOUNTER — Other Ambulatory Visit: Payer: Self-pay | Admitting: Gastroenterology

## 2023-05-18 ENCOUNTER — Telehealth: Payer: Self-pay | Admitting: Physician Assistant

## 2023-05-18 MED ORDER — OMEPRAZOLE 40 MG PO CPDR: DELAYED_RELEASE_CAPSULE | ORAL | 2 refills | Status: DC

## 2023-05-18 NOTE — Telephone Encounter (Signed)
 Okay to refill it until she has her office visit. Thanks

## 2023-05-18 NOTE — Telephone Encounter (Signed)
 Refills sent to get patient to May appointment

## 2023-05-18 NOTE — Telephone Encounter (Signed)
 Inbound call from patient requesting a refill for omeprazole. Patient has been scheduled for 5/9. Requesting a refill until appointment. Please advise, thank you.

## 2023-05-18 NOTE — Telephone Encounter (Signed)
 Message to Dr. Adela Lank to advise.

## 2023-08-07 ENCOUNTER — Ambulatory Visit: Payer: Medicare Other | Admitting: Gastroenterology

## 2023-08-07 ENCOUNTER — Encounter: Payer: Self-pay | Admitting: Gastroenterology

## 2023-08-07 VITALS — BP 118/86 | HR 73 | Ht 60.0 in | Wt 166.0 lb

## 2023-08-07 DIAGNOSIS — Z860100 Personal history of colon polyps, unspecified: Secondary | ICD-10-CM | POA: Diagnosis not present

## 2023-08-07 DIAGNOSIS — K219 Gastro-esophageal reflux disease without esophagitis: Secondary | ICD-10-CM

## 2023-08-07 DIAGNOSIS — Z79899 Other long term (current) drug therapy: Secondary | ICD-10-CM | POA: Diagnosis not present

## 2023-08-07 NOTE — Progress Notes (Signed)
 HPI :  73 year old female here for follow-up visit for GERD, long-term PPI use, history of iron deficiency, colon polyp.  Recall she historically has had longstanding reflux symptoms.  She had multiple EGDs with Dr. Grandville Lax as outlined below, she had an irregular Z-line with biopsies showing Barrett's esophagus in 2011, 2013, 2015.  She had a follow-up EGD with me in April 2018 which showed an irregular Z-line with a slight extension of salmon-colored mucosa less than 1 cm length.  Biopsies did not show Barrett's esophagus or dysplasia.    She had a mild iron deficiency anemia in December 2022.  She underwent EGD with me in March 2023 and colonoscopy.  In short, she does not have Barrett's esophagus, on her second exam in a row now.  EGD showed no cause for iron deficiency anemia nor did colonoscopy.  A few small polyps removed, as outlined below.  She has been on omeprazole  40 mg daily since our last visit.  She states this works pretty well to control her symptoms of reflux.  Recall she does have osteopenia.  I previously had discussed trying to taper her to a lower dose of possible and she cannot recall if she tried that.  No dysphagia, no nausea or vomiting, no abdominal pains on routine basis.  She is interested in tapering her dosing of omeprazole  as tolerated.  Otherwise she has been off all iron.  Her iron studies have normalized as of labs on 4/28 and her CBC is normal as well.  No blood in her stools.  She takes a fiber supplement daily and keeps her bowels regular.  Her colonoscopy showed a few small sessile serrated polyps.     Endoscopic history   EGD 06/06/21: - Esophagogastric landmarks were identified: the Z-line was found at 34 cm, the gastroesophageal junction was found at 34 cm and the upper extent of the gastric folds was found at 36 cm from the incisors. Findings: - A 2 cm hiatal hernia was present. - The Z-line was slightly irregular but did not meet criteria for Barrett's. -  The exam of the esophagus was otherwise normal. - The entire examined stomach was normal. Biopsies were taken with a cold forceps for Helicobacter pylori testing. 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 - Two diminutive angiodysplastic lesions without bleeding were found in the second portion of the duodenum. - The exam of the duodenum was otherwise normal. - Biopsies for histology were taken with a cold forceps in the duodenal bulb and in the second portion of the duodenum for evaluation of celiac disease.  Colonoscopy 06/06/21: - The perianal and digital rectal examinations were normal. - The terminal ileum appeared normal. - Three sessile polyps were found in the ascending colon. The polyps were diminutive in size. These polyps were removed with a cold snare. Resection and retrieval were complete. - A few medium-mouthed diverticula were found in the left colon. - Internal hemorrhoids were found during retroflexion. - The exam was otherwise without abnormality.   1. 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 Surgical [P], gastric antrum and gastric body - GASTRIC OXYNTIC MUCOSA WITH PARIETAL CELL HYPERPLASIA AS CAN BE SEEN IN HYPERGASTRINEMIC STATES SUCH AS PPI THERAPY. - GASTRIC ANTRAL MUCOSA WITH NO SPECIFIC HISTOPATHOLOGIC CHANGES - HELICOBACTER PYLORI-LIKE ORGANISMS ARE NOT IDENTIFIED ON ROUTINE H&E  STAIN 3. Surgical [P], colon, ascending, polyp (3) - TUBULAR ADENOMA(S) - NEGATIVE FOR HIGH-GRADE DYSPLASIA OR MALIGNANCY  Repeat colonoscopy 5 years   Colonoscopy 07/25/19 - - The perianal and digital rectal examinations were normal. - A few medium-mouthed diverticula were found in the left colon. - Internal hemorrhoids were found during retroflexion. The hemorrhoids were moderate. - The  exam was otherwise without abnormality. No polyps.   EGD 07/08/2016 -  - A 3 cm hiatal hernia was present. - The Z-line was irregular with a small extension of a tongue of salmon colored mucosa, although < 1cm in length and without nodularity. Given the patient's reported history of Barrett's esophagus, biopsies were taken with a cold forceps for histology. - The exam of the esophagus was otherwise normal. No esophagitis. - The entire examined stomach was normal. 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 Biopsies were taken with a cold forceps for histology to ensure no adenomatous change. - The exam of the duodenum was otherwise normal.   1. Surgical [P], duodenal bulb - FINDINGS CONSISTENT WITH GASTRIC HETEROTOPIA. - NO DYSPLASIA OR MALIGNANCY IDENTIFIED. 2. Surgical [P], GE junction - BENIGN REACTIVE SQUAMOUS MUCOSA. - BENIGN CARDIAC-TYPE GASTRIC MUCOSA WITH ASSOCIATED CHRONIC INFLAMMATION. - NO INTESTINAL METAPLASIA, DYSPLASIA, OR MALIGNANCY. - SEE COMMENT.   EGD 08/05/2013 - 3cm hiatal hernia, nonobstructing Shatski ring, irregular z-line - biopsies c/w Barrett's EGD 06/13/2011 - irregular z-line - biopsies c/w Barrett's esophagus EGD 06/06/2009 - esophagitis, small hiatal hernia, gastritis - biopses c/w Barrett's Colonoscopy 06/06/2009 - diverticulosis, hemorrhoids Colonoscopy 07/28/2002 - normal colonoscopy     Past Medical History:  Diagnosis Date   Allergy    seasonal    Anxiety    Barretts esophagus    Cataract    GERD (gastroesophageal reflux disease)    Hiatal hernia    Hyperlipidemia    Hypertension    white coat syndrome   Migraine headache with aura    Mitral regurgitation 05-19-11    and Tricuspid Regurgitation(had Echo w/Bubble study)for migraines   Rheumatoid arthritis (HCC) 12/2012   Sleep apnea    4/21-not using cpap, getting fitted for mouthguard     Past Surgical History:  Procedure  Laterality Date   CESAREAN SECTION  1976   COLONOSCOPY  2011   LAPAROSCOPIC OOPHORECTOMY  2005   UPPER GASTROINTESTINAL ENDOSCOPY     VAGINAL HYSTERECTOMY  1995   Family History  Problem Relation Age of Onset   Endometrial cancer Mother    Heart disease Father    Colon polyps Sister    Heart disease Maternal Grandmother    Stroke Maternal Grandmother    Pancreatic cancer Maternal Grandfather    Heart disease Paternal Grandfather    Stroke Paternal Grandfather    Colon cancer Neg Hx    Esophageal cancer Neg Hx    Rectal cancer Neg Hx    Stomach cancer Neg Hx    Social History   Tobacco Use   Smoking status: Former    Current packs/day: 0.00    Types: Cigarettes    Start date: 03/31/1980    Quit date: 03/31/1993    Years since quitting: 30.3   Smokeless tobacco: Never  Vaping Use   Vaping status: Never Used  Substance Use Topics   Alcohol use: Yes    Alcohol/week: 2.0 standard drinks of alcohol    Types: 2 Glasses of wine per week    Comment: Wine sometimes-weekends  Drug use: No   Current Outpatient Medications  Medication Sig Dispense Refill   aspirin EC 81 MG tablet Take 81 mg by mouth daily.     Cholecalciferol (VITAMIN D ) 50 MCG (2000 UT) tablet Take 2,000 Units by mouth daily.     DULoxetine (CYMBALTA) 30 MG capsule Take 30 mg by mouth daily.     FIBER ADULT GUMMIES PO Take 1 tablet by mouth daily.     omeprazole  (PRILOSEC) 40 MG capsule TAKE 1-2 CAPSULES BY MOUTH DAILY. Please keep your May appointment for further refills. 60 capsule 2   promethazine  (PHENERGAN ) 12.5 MG tablet TAKE 1 TABLET (12.5 MG TOTAL) BY MOUTH EVERY 6 (SIX) HOURS AS NEEDED FOR NAUSEA OR VOMITING. 30 tablet 1   propranolol  ER (INDERAL  LA) 60 MG 24 hr capsule Take 1 capsule (60 mg total) by mouth daily. 90 capsule 3   Rimegepant Sulfate (NURTEC) 75 MG TBDP Take 75 mg by mouth daily as needed. For migraines. Take as close to onset of migraine as possible. One daily maximum. 8 tablet 11    simvastatin  (ZOCOR ) 20 MG tablet Take 20 mg by mouth daily at 6 PM.      traMADol (ULTRAM) 50 MG tablet every 6 (six) hours as needed.      traZODone (DESYREL) 50 MG tablet Take 50 mg by mouth at bedtime.     ferrous sulfate 325 (65 FE) MG tablet Take 1 tablet by mouth daily with breakfast. (Patient not taking: Reported on 08/07/2023)     hydroxychloroquine (PLAQUENIL) 200 MG tablet Take 200 mg by mouth 2 (two) times daily. (Patient not taking: Reported on 08/07/2023)     ibuprofen  (ADVIL ) 800 MG tablet Take 800 mg by mouth as needed. (Patient not taking: Reported on 08/07/2023)     Probiotic Product (ALIGN PO) Take 1 tablet by mouth daily. (Patient not taking: Reported on 08/07/2023)     No current facility-administered medications for this visit.   Allergies  Allergen Reactions   Codeine Nausea Only and Nausea And Vomiting   Humira [Adalimumab] Rash   Sulfonamide Derivatives Rash     Review of Systems: All systems reviewed and negative except where noted in HPI.    Physical Exam: BP 118/86   Pulse 73   Ht 5' (1.524 m)   Wt 166 lb (75.3 kg)   SpO2 95%   BMI 32.42 kg/m  Constitutional: Pleasant,well-developed, female in no acute distress. Neurological: Alert and oriented to person place and time. Psychiatric: Normal mood and affect. Behavior is normal.   ASSESSMENT: 73 y.o. female here for assessment of the following  1. Gastroesophageal reflux disease, unspecified whether esophagitis present   2. Long-term current use of proton pump inhibitor therapy   3. History of colon polyps    Reviewed her prior endoscopies with her.  I do not think she has Barrett's esophagus and reassured her about this.  Management of her reflux is based on symptom control alone.  Currently on 40 mg of omeprazole  daily and working quite well for her.  We discussed long-term risks of chronic PPI use, she does have osteopenia and understands this can increase her fracture risk.  We discussed that long-term we  want her to use the lowest dose of medication needed to control symptoms.  I offered to try her on a 20 mg/day dosing or she can try taking 40 mg every other day.  She would like to try 40 mg every other day initially, will contact me if  she wants to try 20 mg daily otherwise her can try this over-the-counter.  I think it unlikely she would be able to come off PPI given her duration of symptoms historically but can try to wean down as she tolerates.  If 40 mg is the lowest dose needed to control her symptoms she can continue that if needed.  I reassured her I do not think she needs surveillance of her esophagus anymore for routine endoscopies as she does not have Barrett's.  Reason to do endoscopy would be based on symptoms moving forward.  Otherwise due for surveillance colonoscopy in 2028, she understands and is willing to do it at that time.  PLAN: - discussed long term risks / benefits of chronic PPI use - use lowest dose needed of omeprazole  needed to control symptoms, she has osteopenia - 40mg  omeprazole  now, will try to wean, take every other day or taper to 20mg  as tolerated - reassured she does not have Barrett's, do not think she needs surveillance as long as symptoms remain controlled - colonoscopy due 5 years after her last exam, 2028 - f/u one year or sooner if issues  Christi Coward, MD [facility]

## 2023-08-07 NOTE — Patient Instructions (Signed)
 If your blood pressure at your visit was 140/90 or greater, please contact your primary care physician to follow up on this. ______________________________________________________  If you are age 71 or older, your body mass index should be between 23-30. Your Body mass index is 32.42 kg/m. If this is out of the aforementioned range listed, please consider follow up with your Primary Care Provider.  If you are age 39 or younger, your body mass index should be between 19-25. Your Body mass index is 32.42 kg/m. If this is out of the aformentioned range listed, please consider follow up with your Primary Care Provider.  ________________________________________________________  The [facility] providers would like to encourage you to use MYCHART to communicate with providers for non-urgent requests or questions.  Due to long hold times on the telephone, sending your provider a message by [facility] may be a faster and more efficient way to get a response.  Please allow 48 business hours for a response.  Please remember that this is for non-urgent requests.  _______________________________________________________  Due to recent changes in healthcare laws, you may see the results of your imaging and laboratory studies on MyChart before your provider has had a chance to review them.  We understand that in some cases there may be results that are confusing or concerning to you. Not all laboratory results come back in the same time frame and the provider may be waiting for multiple results in order to interpret others.  Please give us  48 hours in order for your provider to thoroughly review all the results before contacting the office for clarification of your results.   Please follow up in 1 year.  Thank you for entrusting me with your care and for choosing [facility], Dr. Alvester Johnson

## 2023-11-02 ENCOUNTER — Telehealth: Payer: Self-pay | Admitting: Gastroenterology

## 2023-11-02 MED ORDER — OMEPRAZOLE 20 MG PO CPDR: 20.0000 mg | DELAYED_RELEASE_CAPSULE | Freq: Every day | ORAL | 2 refills | Status: AC

## 2023-11-02 NOTE — Telephone Encounter (Signed)
 Called and spoke to patient  She had an appointment in May of this year.  Dr. Leigh wanted her to reduce to 40 mg every other day or decrease to 20 mg once daily.  We agreed I would send 20 mg tablets and she would try to take one a day (20 mg) but she could increase back to 40mg  (2 capsules) a day as needed.  She would like scripts to go to [facility]. Ninety day script sent with 2 refills

## 2023-11-02 NOTE — Telephone Encounter (Signed)
 Patient called and stated that she is needing a refill on medication called Omeprazole . Patient stated that when she call her pharmacy for a refill on her Omeprazole  she was told the provider denied request for a refill. Patient is unsure why when she was seen back in may of this year. Patient is requesting a call back. Please advise.

## 2023-12-09 ENCOUNTER — Other Ambulatory Visit (HOSPITAL_COMMUNITY): Payer: Self-pay | Admitting: Orthopedic Surgery

## 2023-12-09 ENCOUNTER — Ambulatory Visit (HOSPITAL_COMMUNITY)
Admission: RE | Admit: 2023-12-09 | Discharge: 2023-12-09 | Disposition: A | Discharge status: Home / Self Care | Source: Ambulatory Visit | Attending: Vascular Surgery | Admitting: Vascular Surgery

## 2023-12-09 DIAGNOSIS — M79604 Pain in right leg: Secondary | ICD-10-CM | POA: Insufficient documentation

## 2023-12-09 DIAGNOSIS — M79605 Pain in left leg: Secondary | ICD-10-CM | POA: Insufficient documentation

## 2023-12-09 DIAGNOSIS — M7989 Other specified soft tissue disorders: Secondary | ICD-10-CM
